# Patient Record
Sex: Female | Born: 1975 | Race: White | Hispanic: No | Marital: Single | Smoking: Current some day smoker
Health system: Southern US, Community
[De-identification: ages and names within clinical notes are randomized; demographics above are authoritative.]

## PROBLEM LIST (undated history)

## (undated) DIAGNOSIS — I10 Essential (primary) hypertension: Secondary | ICD-10-CM

---

## 2003-12-29 HISTORY — PX: PARTIAL HYSTERECTOMY: SHX80

## 2005-02-12 ENCOUNTER — Ambulatory Visit: Payer: Self-pay | Admitting: Obstetrics & Gynecology

## 2005-02-25 ENCOUNTER — Emergency Department: Payer: Self-pay | Admitting: Emergency Medicine

## 2005-02-25 ENCOUNTER — Ambulatory Visit: Payer: Self-pay | Admitting: Internal Medicine

## 2011-09-04 ENCOUNTER — Ambulatory Visit: Payer: Self-pay

## 2011-09-10 ENCOUNTER — Ambulatory Visit: Payer: Self-pay

## 2012-03-01 ENCOUNTER — Ambulatory Visit: Payer: Self-pay

## 2014-07-17 ENCOUNTER — Emergency Department: Payer: Self-pay | Admitting: Emergency Medicine

## 2014-07-17 LAB — COMPREHENSIVE METABOLIC PANEL
Albumin: 3.5 g/dL (ref 3.4–5.0)
Alkaline Phosphatase: 62 U/L
Anion Gap: 4 — ABNORMAL LOW (ref 7–16)
BUN: 8 mg/dL (ref 7–18)
Bilirubin,Total: 0.2 mg/dL (ref 0.2–1.0)
Calcium, Total: 8.5 mg/dL (ref 8.5–10.1)
Chloride: 105 mmol/L (ref 98–107)
Co2: 28 mmol/L (ref 21–32)
Creatinine: 0.72 mg/dL (ref 0.60–1.30)
EGFR (African American): >60
EGFR (Non-African Amer.): >60
Glucose: 103 mg/dL — ABNORMAL HIGH (ref 65–99)
Osmolality: 272 (ref 275–301)
Potassium: 3.6 mmol/L (ref 3.5–5.1)
SGOT(AST): 25 U/L (ref 15–37)
SGPT (ALT): 20 U/L (ref 12–78)
Sodium: 137 mmol/L (ref 136–145)
Total Protein: 7.6 g/dL (ref 6.4–8.2)

## 2014-07-17 LAB — TROPONIN I: Troponin-I: <0.02 ng/mL

## 2014-07-17 LAB — CBC
HCT: 40.2 % (ref 35.0–47.0)
HGB: 13.5 g/dL (ref 12.0–16.0)
MCH: 29.7 pg (ref 26.0–34.0)
MCHC: 33.5 g/dL (ref 32.0–36.0)
MCV: 89 fL (ref 80–100)
Platelet: 204 10*3/uL (ref 150–440)
RBC: 4.54 10*6/uL (ref 3.80–5.20)
RDW: 13.3 % (ref 11.5–14.5)
WBC: 8.7 10*3/uL (ref 3.6–11.0)

## 2014-12-13 DIAGNOSIS — I10 Essential (primary) hypertension: Secondary | ICD-10-CM | POA: Insufficient documentation

## 2014-12-13 DIAGNOSIS — F17210 Nicotine dependence, cigarettes, uncomplicated: Secondary | ICD-10-CM | POA: Insufficient documentation

## 2016-06-17 ENCOUNTER — Emergency Department: Payer: Self-pay

## 2016-06-17 ENCOUNTER — Other Ambulatory Visit: Payer: Self-pay

## 2016-06-17 ENCOUNTER — Observation Stay
Admission: EM | Admit: 2016-06-17 | Discharge: 2016-06-19 | Disposition: A | Discharge status: Home / Self Care | Payer: Self-pay | Attending: General Surgery | Admitting: General Surgery

## 2016-06-17 ENCOUNTER — Encounter: Payer: Self-pay | Admitting: Emergency Medicine

## 2016-06-17 DIAGNOSIS — Z803 Family history of malignant neoplasm of breast: Secondary | ICD-10-CM | POA: Insufficient documentation

## 2016-06-17 DIAGNOSIS — Z880 Allergy status to penicillin: Secondary | ICD-10-CM | POA: Insufficient documentation

## 2016-06-17 DIAGNOSIS — Z90710 Acquired absence of both cervix and uterus: Secondary | ICD-10-CM | POA: Insufficient documentation

## 2016-06-17 DIAGNOSIS — Z87892 Personal history of anaphylaxis: Secondary | ICD-10-CM | POA: Insufficient documentation

## 2016-06-17 DIAGNOSIS — K819 Cholecystitis, unspecified: Secondary | ICD-10-CM

## 2016-06-17 DIAGNOSIS — K810 Acute cholecystitis: Secondary | ICD-10-CM | POA: Insufficient documentation

## 2016-06-17 DIAGNOSIS — F17200 Nicotine dependence, unspecified, uncomplicated: Secondary | ICD-10-CM | POA: Insufficient documentation

## 2016-06-17 DIAGNOSIS — I10 Essential (primary) hypertension: Secondary | ICD-10-CM | POA: Insufficient documentation

## 2016-06-17 DIAGNOSIS — Z8041 Family history of malignant neoplasm of ovary: Secondary | ICD-10-CM | POA: Insufficient documentation

## 2016-06-17 DIAGNOSIS — R1011 Right upper quadrant pain: Secondary | ICD-10-CM

## 2016-06-17 DIAGNOSIS — Z8049 Family history of malignant neoplasm of other genital organs: Secondary | ICD-10-CM | POA: Insufficient documentation

## 2016-06-17 DIAGNOSIS — K8010 Calculus of gallbladder with chronic cholecystitis without obstruction: Principal | ICD-10-CM | POA: Insufficient documentation

## 2016-06-17 HISTORY — DX: Essential (primary) hypertension: I10

## 2016-06-17 LAB — URINALYSIS COMPLETE WITH MICROSCOPIC (ARMC ONLY)
Bilirubin Urine: NEGATIVE
Glucose, UA: 50 mg/dL — AB
Ketones, ur: NEGATIVE mg/dL
Leukocytes, UA: NEGATIVE
Nitrite: NEGATIVE
Protein, ur: 30 mg/dL — AB
Specific Gravity, Urine: 1.015 (ref 1.005–1.030)
pH: 7 (ref 5.0–8.0)

## 2016-06-17 LAB — CBC
HCT: 41 % (ref 35.0–47.0)
Hemoglobin: 13.9 g/dL (ref 12.0–16.0)
MCH: 29.2 pg (ref 26.0–34.0)
MCHC: 33.9 g/dL (ref 32.0–36.0)
MCV: 85.9 fL (ref 80.0–100.0)
Platelets: 228 10*3/uL (ref 150–440)
RBC: 4.77 MIL/uL (ref 3.80–5.20)
RDW: 14.8 % — ABNORMAL HIGH (ref 11.5–14.5)
WBC: 13.4 10*3/uL — ABNORMAL HIGH (ref 3.6–11.0)

## 2016-06-17 LAB — COMPREHENSIVE METABOLIC PANEL
ALT: 20 U/L (ref 14–54)
AST: 19 U/L (ref 15–41)
Albumin: 3.8 g/dL (ref 3.5–5.0)
Alkaline Phosphatase: 94 U/L (ref 38–126)
Anion gap: 8 (ref 5–15)
BUN: 10 mg/dL (ref 6–20)
CO2: 27 mmol/L (ref 22–32)
Calcium: 9.9 mg/dL (ref 8.9–10.3)
Chloride: 101 mmol/L (ref 101–111)
Creatinine, Ser: 0.81 mg/dL (ref 0.44–1.00)
GFR calc Af Amer: >60 mL/min (ref >60)
GFR calc non Af Amer: >60 mL/min (ref >60)
Glucose, Bld: 141 mg/dL — ABNORMAL HIGH (ref 65–99)
Potassium: 3.8 mmol/L (ref 3.5–5.1)
Sodium: 136 mmol/L (ref 135–145)
Total Bilirubin: 0.4 mg/dL (ref 0.3–1.2)
Total Protein: 7.6 g/dL (ref 6.5–8.1)

## 2016-06-17 LAB — LIPASE, BLOOD: Lipase: 27 U/L (ref 11–51)

## 2016-06-17 MED ORDER — DIPHENHYDRAMINE HCL 50 MG/ML IJ SOLN: 25.0000 mg | Freq: Four times a day (QID) | INTRAMUSCULAR | Status: DC | PRN

## 2016-06-17 MED ORDER — SODIUM CHLORIDE 0.9 % IV BOLUS (SEPSIS)
1000.0000 mL | Freq: Once | INTRAVENOUS | Status: AC
  Administered 2016-06-17: 1000 mL via INTRAVENOUS

## 2016-06-17 MED ORDER — DIATRIZOATE MEGLUMINE & SODIUM 66-10 % PO SOLN
15.0000 mL | Freq: Once | ORAL | Status: AC
  Administered 2016-06-17: 15 mL via ORAL

## 2016-06-17 MED ORDER — DIPHENHYDRAMINE HCL 25 MG PO CAPS: 25.0000 mg | ORAL_CAPSULE | Freq: Four times a day (QID) | ORAL | Status: DC | PRN

## 2016-06-17 MED ORDER — FENTANYL CITRATE (PF) 100 MCG/2ML IJ SOLN
25.0000 ug | Freq: Once | INTRAMUSCULAR | Status: AC
  Administered 2016-06-17: 25 ug via INTRAVENOUS
  Filled 2016-06-17: qty 2

## 2016-06-17 MED ORDER — FAMOTIDINE IN NACL 20-0.9 MG/50ML-% IV SOLN
20.0000 mg | Freq: Once | INTRAVENOUS | Status: AC
  Administered 2016-06-17: 20 mg via INTRAVENOUS
  Filled 2016-06-17: qty 50

## 2016-06-17 MED ORDER — FENTANYL CITRATE (PF) 100 MCG/2ML IJ SOLN
50.0000 ug | Freq: Once | INTRAMUSCULAR | Status: AC
  Administered 2016-06-17: 50 ug via INTRAVENOUS
  Filled 2016-06-17: qty 2

## 2016-06-17 MED ORDER — MORPHINE SULFATE (PF) 4 MG/ML IV SOLN
4.0000 mg | INTRAVENOUS | Status: DC | PRN
  Administered 2016-06-17: 2 mg via INTRAVENOUS
  Administered 2016-06-18 (×3): 4 mg via INTRAVENOUS
  Filled 2016-06-17 (×4): qty 1

## 2016-06-17 MED ORDER — ONDANSETRON 4 MG PO TBDP
4.0000 mg | ORAL_TABLET | Freq: Four times a day (QID) | ORAL | Status: DC | PRN
  Filled 2016-06-17: qty 1

## 2016-06-17 MED ORDER — GI COCKTAIL ~~LOC~~
30.0000 mL | Freq: Once | ORAL | Status: AC
  Administered 2016-06-17: 30 mL via ORAL
  Filled 2016-06-17: qty 30

## 2016-06-17 MED ORDER — HYDROCHLOROTHIAZIDE 25 MG PO TABS
25.0000 mg | ORAL_TABLET | Freq: Every day | ORAL | Status: DC
  Administered 2016-06-17: 25 mg via ORAL
  Filled 2016-06-17: qty 1

## 2016-06-17 MED ORDER — MORPHINE SULFATE (PF) 4 MG/ML IV SOLN
4.0000 mg | Freq: Once | INTRAVENOUS | Status: AC
Start: 2016-06-17 — End: 2016-06-17
  Administered 2016-06-17: 4 mg via INTRAVENOUS
  Filled 2016-06-17: qty 1

## 2016-06-17 MED ORDER — ONDANSETRON HCL 4 MG/2ML IJ SOLN
4.0000 mg | Freq: Four times a day (QID) | INTRAMUSCULAR | Status: DC | PRN
  Administered 2016-06-18 (×2): 4 mg via INTRAVENOUS
  Filled 2016-06-17: qty 2

## 2016-06-17 MED ORDER — KCL IN DEXTROSE-NACL 20-5-0.45 MEQ/L-%-% IV SOLN
INTRAVENOUS | Status: DC
  Administered 2016-06-18: 02:00:00 via INTRAVENOUS
  Filled 2016-06-17 (×7): qty 1000

## 2016-06-17 MED ORDER — FAMOTIDINE IN NACL 20-0.9 MG/50ML-% IV SOLN
20.0000 mg | Freq: Two times a day (BID) | INTRAVENOUS | Status: DC
  Administered 2016-06-17: 20 mg via INTRAVENOUS
  Filled 2016-06-17 (×2): qty 50

## 2016-06-17 MED ORDER — MORPHINE SULFATE (PF) 4 MG/ML IV SOLN: 4.0000 mg | Freq: Once | INTRAVENOUS | Status: DC

## 2016-06-17 MED ORDER — HYDRALAZINE HCL 20 MG/ML IJ SOLN: 10.0000 mg | INTRAMUSCULAR | Status: DC | PRN

## 2016-06-17 MED ORDER — CIPROFLOXACIN IN D5W 400 MG/200ML IV SOLN
400.0000 mg | Freq: Two times a day (BID) | INTRAVENOUS | Status: DC
  Administered 2016-06-17 – 2016-06-18 (×3): 400 mg via INTRAVENOUS
  Filled 2016-06-17 (×5): qty 200

## 2016-06-17 MED ORDER — IOPAMIDOL (ISOVUE-300) INJECTION 61%
100.0000 mL | Freq: Once | INTRAVENOUS | Status: AC | PRN
  Administered 2016-06-17: 100 mL via INTRAVENOUS

## 2016-06-17 NOTE — ED Provider Notes (Signed)
CSN: [id number]     Arrival date & time 06/17/16  1020 History   First MD Initiated Contact with Patient 06/17/16 1111     Chief Complaint  Patient presents with  . Abdominal Pain     (Consider location/radiation/quality/duration/timing/severity/associated sxs/prior Treatment) The history is provided by the patient.  Ansley Mangiapane is a 40 y.o. female history of hypertension not on meds here presenting with abdominal pain. Epigastric pain for the last several months. It's that since yesterday and has been constant and radiated to her back and to her umbilicus. Feeling nauseated but denies any vomiting. She denies any urinary symptoms or any constipation or diarrhea. Denies any fevers. Patient states that she has a history of hypertension but does not take any medications because she has no insurance. Denies any chest pain or shortness breath.      Past Medical History  Diagnosis Date  . Hypertension    History reviewed. No pertinent past surgical history. History reviewed. No pertinent family history. Social History  Substance Use Topics  . Smoking status: Current Every Day Smoker  . Smokeless tobacco: None  . Alcohol Use: Yes   OB History    No data available     Review of Systems  Gastrointestinal: Positive for abdominal pain.  All other systems reviewed and are negative.     Allergies  Penicillins  Home Medications   Prior to Admission medications   Not on File   BP 175/90 mmHg  Pulse 87  Temp(Src) 98.5 F (36.9 C) (Oral)  Resp 16  Ht  (1.575 m)  Wt 200 lb (90.719 kg)  BMI 36.57 kg/m2  SpO2 100% Physical Exam  Constitutional: She is oriented to person, place, and time.  Slightly uncomfortable   HENT:  Head: Normocephalic.  Mouth/Throat: Oropharynx is clear and moist.  Eyes: Conjunctivae are normal. Pupils are equal, round, and reactive to light.  Neck: Normal range of motion. Neck supple.  Cardiovascular: Normal rate, regular rhythm and normal  heart sounds.   Pulmonary/Chest: Effort normal and breath sounds normal. No respiratory distress. She has no wheezes. She has no rales.  Abdominal: Soft. Bowel sounds are normal.  + mild diffuse upper abdominal tenderness and periumbilical tenderness. No murphy sign   Musculoskeletal: Normal range of motion. She exhibits no edema or tenderness.  Neurological: She is alert and oriented to person, place, and time. No cranial nerve deficit. Coordination normal.  Skin: Skin is warm and dry.  Psychiatric: She has a normal mood and affect. Her behavior is normal. Judgment and thought content normal.  Nursing note and vitals reviewed.   ED Course  Procedures (including critical care time) Labs Review Labs Reviewed  COMPREHENSIVE METABOLIC PANEL - Abnormal; Notable for the following:    Glucose, Bld 141 (*)    All other components within normal limits  CBC - Abnormal; Notable for the following:    WBC 13.4 (*)    RDW 14.8 (*)    All other components within normal limits  URINALYSIS COMPLETEWITH MICROSCOPIC ([facility] ONLY) - Abnormal; Notable for the following:    Color, Urine YELLOW (*)    APPearance CLOUDY (*)    Glucose, UA 50 (*)    Hgb urine dipstick 1+ (*)    Protein, ur 30 (*)    Bacteria, UA RARE (*)    Squamous Epithelial / LPF 0-5 (*)    All other components within normal limits  LIPASE, BLOOD    Imaging Review Ct Abdomen Pelvis W  Contrast  06/17/2016  CLINICAL DATA:  Mid upper abdominal pain, nausea starting 3 a.m. today EXAM: CT ABDOMEN AND PELVIS WITH CONTRAST TECHNIQUE: Multidetector CT imaging of the abdomen and pelvis was performed using the standard protocol following bolus administration of intravenous contrast. CONTRAST:  100mL ISOVUE-300 IOPAMIDOL (ISOVUE-300) INJECTION 61% COMPARISON:  CT 09/10/2011 FINDINGS: Lower chest: Lung bases are clear. No effusions. Heart is normal size. Hepatobiliary: No focal hepatic abnormality. Gallbladder is distended. Layering gallstone  within the proximal gallbladder and possibly within the gallbladder neck. Pancreas: No focal abnormality or ductal dilatation. Spleen: 3.8 cm lesion within the spleen, enlarged since prior study when this measured 1.7 cm. Adrenals/Urinary Tract: No adrenal abnormality. No focal renal abnormality. No stones or hydronephrosis. Urinary bladder is unremarkable. Stomach/Bowel: Appendix is normal. Stomach, large and small bowel grossly unremarkable. Vascular/Lymphatic: No evidence of aneurysm or adenopathy. Reproductive: Prior hysterectomy.  No adnexal masses. Other: No free fluid or free air. Musculoskeletal: No acute bony abnormality. IMPRESSION: Gallstones within the gallbladder and possibly gallbladder neck. Gallbladder is mildly distended. If there is clinical concern for cholecystitis, ultrasound may be useful. Enlarging low-density splenic lesion, now 3.8 cm. It is most likely reflects enlarging hemangioma. Consider further evaluation with MRI. Normal appendix. Electronically Signed   By: Charlett NoseKevin  Dover M.D.   On: 06/17/2016 13:17   [place] Abdomen Limited Ruq  06/17/2016  CLINICAL DATA:  RIGHT upper quadrant abdominal pain, hypertension, smoker EXAM: US ABDOMEN LIMITED - RIGHT UPPER QUADRANT COMPARISON:  CT abdomen and pelvis 06/17/2016 FINDINGS: Gallbladder: Multiple shadowing calculi in gallbladder up to 18 mm diameter. Some of the stones are mobile but a fixed stone 9 mm diameter is seen at the gallbladder neck. Mild gallbladder wall thickening. No pericholecystic fluid or sonographic Murphy sign. Early acute cholecystitis questioned. Common bile duct: Diameter: 3 mm diameter , normal Liver: Normal appearance No RIGHT upper quadrant free fluid IMPRESSION: Cholelithiasis with fixed stone in gallbladder neck and thickened gallbladder wall, cannot exclude acute cholecystitis. No biliary dilatation. Electronically Signed   By: Ulyses SouthwardMark  Boles M.D.   On: 06/17/2016 14:15   I have personally reviewed and evaluated these  images and lab results as part of my medical decision-making.   EKG Interpretation None       ED ECG REPORT I, YAO, DAVID, the attending physician, personally viewed and interpreted this ECG.   Date: 06/17/2016  EKG Time: 10:45 am  Rate: 77  Rhythm: normal EKG, normal sinus rhythm  Axis: normal  Intervals:none  ST&T Change: nonspecific    MDM   Final diagnoses:  RUQ abdominal pain   Wilhelmina McardleStephanie Yuhasz is a 40 y.o. female here with ab pain. Chronic abdominal pain worsening and constant since yesterday. Mild epigastric and periumbilical tenderness. Consider biliary colic vs early appy. Will get labs, UA, CT ab/pel. Hypertensive 175/90 but has hx of hypertension and not on meds. I doubt dissection or ACS. Can be secondary to pain vs worsening chronic hypertension. Will give HCTZ and reassess.   2:32 PM CT showed stone in the gallbladder, nl appendix. RUQ US showed cholelithiasis with stone in the neck and thickened wall concerning for possible acute chole. Still has pain despite multiple rounds of pain meds. WBC 13, LFTs nl. Consulted surgery to admit for acute cholecystitis.   2: 50 pm Talked to Dr. Orvis BrillLoflin from surgery. She will be in surgery for several hours. She states that patient can wait for her or see her in the office. Patient still in pain and rather wait  to get cholecystectomy. Will keep NPO and continue IVF.   6 pm Dr. Orvis Brill still in surgery. Talked to her nurse. Patient still in pain and has been NPO. Dr. Orvis Brill will see patient after she is done with her case.     Richardean Canal, MD 06/17/16 [phone]

## 2016-06-17 NOTE — H&P (Signed)
Patient ID: Jasmine Miller, female   DOB: 13-Sep-1976, 40 y.o.   MRN: [id number]  CC: RUQ PAIN  HPI Jasmine McardleStephanie Dettinger is a 40 y.o. female who presents emergency department today after an acute onset of right upper quadrant pain. Patient reports that the pain started acutely overnight and did not let up. This prompted her to come to the emergency department earlier today. Patient describes the pain as being a band that goes across her entire midabdomen and shoots around to her right back. It is a sharp stabbing pain minutes at its worst. She had nausea but no vomiting and denies any fevers, chills, chest pain, shortness of breath, diarrhea, constipation. She states that over the last several months she's had intermittent pains just like this. However, those pains would only last for 30 minutes to 2 hours and then go away. This pain has continued and returned every couple hours after pain medications in the ER. Other than this she's been in her usual state of health. She is known to have hypertension but does not take any meds.  HPI  Past Medical History  Diagnosis Date  . Hypertension     History reviewed. No pertinent past surgical history. Patient has had a hysterectomy  History reviewed. No pertinent family history. Multiple family members in her past have had cancer. This includes breast, ovarian, cervical.  Social History Social History  Substance Use Topics  . Smoking status: Current Every Day Smoker  . Smokeless tobacco: None  . Alcohol Use: Yes    Allergies  Allergen Reactions  . Penicillins Anaphylaxis    Current Facility-Administered Medications  Medication Dose Route Frequency Provider Last Rate Last Dose  . hydrochlorothiazide (HYDRODIURIL) tablet 25 mg  25 mg Oral Daily Richardean Canalavid H Yao, MD   25 mg at 06/17/16 1151   No current outpatient prescriptions on file.     Review of Systems A Multi-point review of systems was asked and was negative except for the findings  documented in the history of present illness  Physical Exam Blood pressure 156/88, pulse 57, temperature 98.5 F (36.9 C), temperature source Oral, resp. rate 16, height 5\' 2"  (1.575 m), weight 90.719 kg (200 lb), SpO2 97 %. CONSTITUTIONAL: No acute distress. EYES: Pupils are equal, round, and reactive to light, Sclera are non-icteric. EARS, NOSE, MOUTH AND THROAT: The oropharynx is clear. The oral mucosa is pink and moist. Hearing is intact to voice. LYMPH NODES:  Lymph nodes in the neck are normal. RESPIRATORY:  Lungs are clear. There is normal respiratory effort, with equal breath sounds bilaterally, and without pathologic use of accessory muscles. CARDIOVASCULAR: Heart is regular without murmurs, gallops, or rubs. GI: The abdomen is soft, and nondistended. Mild tenderness to deep palpation in the right upper quadrant. Negative Murphy sign. There are no palpable masses. There is no hepatosplenomegaly. There are normal bowel sounds in all quadrants. GU: Rectal deferred.   MUSCULOSKELETAL: Normal muscle strength and tone. No cyanosis or edema.   SKIN: Turgor is good and there are no pathologic skin lesions or ulcers. NEUROLOGIC: Motor and sensation is grossly normal. Cranial nerves are grossly intact. PSYCH:  Oriented to person, place and time. Affect is normal.  Data Reviewed Images and labs reviewed. Labs significant with a leukocytosis of 13.4. CT scan and ultrasound both show gallstones and a minimally thickened gallbladder wall at 5 mm on ultrasound. There is no pericholecystic fluid or ductal dilatation. All of her liver functions are within normal limits and her labs. I  have personally reviewed the patient's imaging, laboratory findings and medical records.    Assessment    Cholecystitis    Plan    40 year old female with persistent right upper quadrant pain consistent with cholecystitis. Patient also has a mild leukocytosis and possible gallbladder wall thickening in the setting  of gallstones. The procedure of a laparoscopic cholecystectomy was described in detail.  We discussed the risks and benefits of a laparoscopic cholecystectomy and possible cholangiogram including, but not limited to bleeding, infection, injury to surrounding structures such as the intestine or liver, bile leak, retained gallstones, need to convert to an open procedure, prolonged diarrhea, blood clots such as  DVT, common bile duct injury, anesthesia risks, and possible need for additional procedures.  The likelihood of improvement in symptoms and return to the patient's normal status is good. We discussed the typical post-operative recovery course. Plan will be for admission under observation. We will allow her to have a diet tonight but nothing to eat after midnight. Likely to the operating room first thing in the morning for laparoscopic cholecystectomy to be done by my partner Dr. Orvis Brill.      Time spent with the patient was 30 minutes, with more than 50% of the time spent in face-to-face education, counseling and care coordination.     Ricarda Frame, MD FACS General Surgeon 06/17/2016, 8:58 PM

## 2016-06-17 NOTE — ED Notes (Signed)
Surgery consult at bedside.

## 2016-06-17 NOTE — ED Notes (Signed)
Pt presents with abd pain.

## 2016-06-17 NOTE — ED Provider Notes (Signed)
I accepted care from Dr. Silverio LayYao at 8:45 PM.  I spoke with Dr. Armond HangWoodrum after he saw the patient. This patient will be admitted overnight. She is allowed to eat and then be nothing by mouth after midnight. Likely surgery in the morning.  Governor Rooksebecca Gwenn Teodoro, MD 06/17/16 2046

## 2016-06-18 ENCOUNTER — Encounter: Payer: Self-pay | Admitting: *Deleted

## 2016-06-18 ENCOUNTER — Observation Stay: Payer: MEDICAID | Admitting: Anesthesiology

## 2016-06-18 ENCOUNTER — Encounter
Admission: EM | Disposition: A | Discharge status: Home / Self Care | Payer: Self-pay | Source: Home / Self Care | Attending: Emergency Medicine

## 2016-06-18 DIAGNOSIS — K810 Acute cholecystitis: Secondary | ICD-10-CM

## 2016-06-18 HISTORY — PX: CHOLECYSTECTOMY: SHX55

## 2016-06-18 SURGERY — LAPAROSCOPIC CHOLECYSTECTOMY WITH INTRAOPERATIVE CHOLANGIOGRAM
Anesthesia: General

## 2016-06-18 MED ORDER — DEXAMETHASONE SODIUM PHOSPHATE 10 MG/ML IJ SOLN
INTRAMUSCULAR | Status: DC | PRN
  Administered 2016-06-18: 10 mg via INTRAVENOUS

## 2016-06-18 MED ORDER — BUPIVACAINE HCL (PF) 0.5 % IJ SOLN
INTRAMUSCULAR | Status: AC
  Filled 2016-06-18: qty 30

## 2016-06-18 MED ORDER — ONDANSETRON HCL 4 MG/2ML IJ SOLN
4.0000 mg | Freq: Once | INTRAMUSCULAR | Status: DC | PRN
Start: 2016-06-18 — End: 2016-06-18

## 2016-06-18 MED ORDER — LIDOCAINE HCL (CARDIAC) 20 MG/ML IV SOLN
INTRAVENOUS | Status: DC | PRN
  Administered 2016-06-18: 100 mg via INTRAVENOUS

## 2016-06-18 MED ORDER — CYCLOBENZAPRINE HCL 10 MG PO TABS
10.0000 mg | ORAL_TABLET | Freq: Three times a day (TID) | ORAL | Status: DC
  Administered 2016-06-18 – 2016-06-19 (×2): 10 mg via ORAL
  Filled 2016-06-18 (×2): qty 1

## 2016-06-18 MED ORDER — FENTANYL CITRATE (PF) 100 MCG/2ML IJ SOLN
INTRAMUSCULAR | Status: AC
  Administered 2016-06-18: 25 ug via INTRAVENOUS
  Filled 2016-06-18: qty 2

## 2016-06-18 MED ORDER — ACETAMINOPHEN 10 MG/ML IV SOLN
INTRAVENOUS | Status: AC
  Filled 2016-06-18: qty 100

## 2016-06-18 MED ORDER — ROCURONIUM BROMIDE 100 MG/10ML IV SOLN
INTRAVENOUS | Status: DC | PRN
  Administered 2016-06-18: 45 mg via INTRAVENOUS
  Administered 2016-06-18: 5 mg via INTRAVENOUS
  Administered 2016-06-18: 20 mg via INTRAVENOUS

## 2016-06-18 MED ORDER — BUPIVACAINE HCL (PF) 0.5 % IJ SOLN
INTRAMUSCULAR | Status: DC | PRN
  Administered 2016-06-18: 30 mL

## 2016-06-18 MED ORDER — SUCCINYLCHOLINE CHLORIDE 20 MG/ML IJ SOLN
INTRAMUSCULAR | Status: DC | PRN
  Administered 2016-06-18: 80 mg via INTRAVENOUS

## 2016-06-18 MED ORDER — LACTATED RINGERS IV SOLN
INTRAVENOUS | Status: DC | PRN
  Administered 2016-06-18: 13:00:00 via INTRAVENOUS
  Administered 2016-06-18: 1000 mL

## 2016-06-18 MED ORDER — OXYCODONE HCL 5 MG PO TABS
5.0000 mg | ORAL_TABLET | ORAL | Status: DC | PRN
  Administered 2016-06-18: 10 mg via ORAL
  Administered 2016-06-19: 5 mg via ORAL
  Administered 2016-06-19: 10 mg via ORAL
  Filled 2016-06-18: qty 1
  Filled 2016-06-18 (×2): qty 2

## 2016-06-18 MED ORDER — FENTANYL CITRATE (PF) 100 MCG/2ML IJ SOLN
INTRAMUSCULAR | Status: DC | PRN
  Administered 2016-06-18 (×4): 50 ug via INTRAVENOUS

## 2016-06-18 MED ORDER — MORPHINE SULFATE (PF) 4 MG/ML IV SOLN
4.0000 mg | INTRAVENOUS | Status: DC | PRN
  Administered 2016-06-18: 4 mg via INTRAVENOUS
  Filled 2016-06-18: qty 1

## 2016-06-18 MED ORDER — FENTANYL CITRATE (PF) 100 MCG/2ML IJ SOLN
25.0000 ug | INTRAMUSCULAR | Status: DC | PRN
  Administered 2016-06-18 (×4): 25 ug via INTRAVENOUS

## 2016-06-18 MED ORDER — FENTANYL CITRATE (PF) 100 MCG/2ML IJ SOLN: INTRAMUSCULAR | Status: DC | PRN

## 2016-06-18 MED ORDER — KETOROLAC TROMETHAMINE 30 MG/ML IJ SOLN
INTRAMUSCULAR | Status: DC | PRN
  Administered 2016-06-18: 30 mg via INTRAVENOUS

## 2016-06-18 MED ORDER — ONDANSETRON HCL 4 MG/2ML IJ SOLN
INTRAMUSCULAR | Status: AC
  Filled 2016-06-18: qty 2

## 2016-06-18 MED ORDER — MIDAZOLAM HCL 2 MG/2ML IJ SOLN
INTRAMUSCULAR | Status: DC | PRN
  Administered 2016-06-18: 2 mg via INTRAVENOUS

## 2016-06-18 MED ORDER — SUGAMMADEX SODIUM 200 MG/2ML IV SOLN
INTRAVENOUS | Status: DC | PRN
  Administered 2016-06-18: 181.4 mg via INTRAVENOUS

## 2016-06-18 MED ORDER — PROPOFOL 10 MG/ML IV BOLUS
INTRAVENOUS | Status: DC | PRN
  Administered 2016-06-18: 150 mg via INTRAVENOUS

## 2016-06-18 MED ORDER — KETOROLAC TROMETHAMINE 30 MG/ML IJ SOLN
30.0000 mg | Freq: Four times a day (QID) | INTRAMUSCULAR | Status: DC
  Administered 2016-06-18 – 2016-06-19 (×3): 30 mg via INTRAVENOUS
  Filled 2016-06-18 (×3): qty 1

## 2016-06-18 MED ORDER — ACETAMINOPHEN 325 MG PO TABS: 650.0000 mg | ORAL_TABLET | Freq: Four times a day (QID) | ORAL | Status: DC

## 2016-06-18 MED ORDER — ACETAMINOPHEN 10 MG/ML IV SOLN
INTRAVENOUS | Status: DC | PRN
  Administered 2016-06-18: 1000 mg via INTRAVENOUS

## 2016-06-18 MED ORDER — PHENYLEPHRINE HCL 10 MG/ML IJ SOLN
INTRAMUSCULAR | Status: DC | PRN
  Administered 2016-06-18: 100 ug via INTRAVENOUS
  Administered 2016-06-18: 200 ug via INTRAVENOUS

## 2016-06-18 MED ORDER — EPHEDRINE SULFATE 50 MG/ML IJ SOLN
INTRAMUSCULAR | Status: DC | PRN
Start: 2016-06-18 — End: 2016-06-18
  Administered 2016-06-18: 10 mg via INTRAVENOUS

## 2016-06-18 MED ORDER — PANTOPRAZOLE SODIUM 40 MG PO TBEC
40.0000 mg | DELAYED_RELEASE_TABLET | Freq: Every day | ORAL | Status: DC
  Administered 2016-06-18: 40 mg via ORAL
  Filled 2016-06-18: qty 1

## 2016-06-18 SURGICAL SUPPLY — 42 items
APPLIER CLIP 5 13 M/L LIGAMAX5 (MISCELLANEOUS) ×2
BLADE SURG 15 STRL LF DISP TIS (BLADE) ×1 IMPLANT
BLADE SURG 15 STRL SS (BLADE) ×1
CANISTER SUCT 1200ML W/VALVE (MISCELLANEOUS) ×2 IMPLANT
CATH CHOLANGI 4FR 420404F (CATHETERS) ×2 IMPLANT
CHLORAPREP W/TINT 26ML (MISCELLANEOUS) ×2 IMPLANT
CLIP APPLIE 5 13 M/L LIGAMAX5 (MISCELLANEOUS) ×1 IMPLANT
CONRAY 60ML FOR OR (MISCELLANEOUS) ×2 IMPLANT
DEFOGGER SCOPE WARMER CLEARIFY (MISCELLANEOUS) ×2 IMPLANT
DERMABOND ADVANCED (GAUZE/BANDAGES/DRESSINGS) ×1
DERMABOND ADVANCED .7 DNX12 (GAUZE/BANDAGES/DRESSINGS) ×1 IMPLANT
DRAPE SHEET LG 3/4 BI-LAMINATE (DRAPES) ×2 IMPLANT
ELECT CAUTERY BLADE 6.4 (BLADE) ×2 IMPLANT
ELECT E-Z MONOPOLAR 33 (MISCELLANEOUS) ×2
ELECT REM PT RETURN 9FT ADLT (ELECTROSURGICAL) ×2
ELECTRODE E-Z MONOPOLAR 33 (MISCELLANEOUS) ×1 IMPLANT
ELECTRODE REM PT RTRN 9FT ADLT (ELECTROSURGICAL) ×1 IMPLANT
ENDOPOUCH RETRIEVER 10 (MISCELLANEOUS) ×2 IMPLANT
GLOVE PI ORTHOPRO 6.5 (GLOVE) ×1
GLOVE PI ORTHOPRO STRL 6.5 (GLOVE) ×1 IMPLANT
GOWN STRL REUS W/ TWL LRG LVL3 (GOWN DISPOSABLE) ×3 IMPLANT
GOWN STRL REUS W/TWL LRG LVL3 (GOWN DISPOSABLE) ×3
IRRIGATION STRYKERFLOW (MISCELLANEOUS) ×1 IMPLANT
IRRIGATOR STRYKERFLOW (MISCELLANEOUS) ×2
IV CATH ANGIO 12GX3 LT BLUE (NEEDLE) ×2 IMPLANT
IV NS 1000ML (IV SOLUTION) ×1
IV NS 1000ML BAXH (IV SOLUTION) ×1 IMPLANT
LABEL OR SOLS (LABEL) ×2 IMPLANT
LIQUID BAND (GAUZE/BANDAGES/DRESSINGS) ×2 IMPLANT
NEEDLE HYPO 25X1 1.5 SAFETY (NEEDLE) ×2 IMPLANT
NS IRRIG 500ML POUR BTL (IV SOLUTION) ×2 IMPLANT
PACK LAP CHOLECYSTECTOMY (MISCELLANEOUS) ×2 IMPLANT
PENCIL ELECTRO HAND CTR (MISCELLANEOUS) ×2 IMPLANT
SCISSORS METZENBAUM CVD 33 (INSTRUMENTS) ×2 IMPLANT
SLEEVE ENDOPATH XCEL 5M (ENDOMECHANICALS) ×4 IMPLANT
SUT MNCRL 4-0 (SUTURE) ×1
SUT MNCRL 4-0 27XMFL (SUTURE) ×1
SUT VICRYL 0 AB UR-6 (SUTURE) ×4 IMPLANT
SUTURE MNCRL 4-0 27XMF (SUTURE) ×1 IMPLANT
TROCAR XCEL BLUNT TIP 100MML (ENDOMECHANICALS) ×2 IMPLANT
TROCAR XCEL NON-BLD 5MMX100MML (ENDOMECHANICALS) ×2 IMPLANT
TUBING INSUFFLATOR HI FLOW (MISCELLANEOUS) ×2 IMPLANT

## 2016-06-18 NOTE — Anesthesia Procedure Notes (Signed)
Procedure Name: Intubation Date/Time: 06/18/2016 1:39 PM Performed by: Junious SilkNOLES, Redith Drach Pre-anesthesia Checklist: Patient identified, Patient being monitored, Timeout performed, Emergency Drugs available and Suction available Patient Re-evaluated:Patient Re-evaluated prior to inductionOxygen Delivery Method: Circle system utilized Preoxygenation: Pre-oxygenation with 100% oxygen Intubation Type: IV induction Ventilation: Mask ventilation without difficulty Laryngoscope Size: Mac and 3 Grade View: Grade I Tube type: Oral Tube size: 7.0 mm Number of attempts: 1 Airway Equipment and Method: Stylet Placement Confirmation: ETT inserted through vocal cords under direct vision,  positive ETCO2 and breath sounds checked- equal and bilateral Secured at: 21 cm Tube secured with: Tape Dental Injury: Teeth and Oropharynx as per pre-operative assessment

## 2016-06-18 NOTE — Transfer of Care (Signed)
Immediate Anesthesia Transfer of Care Note  Patient: Wilhelmina McardleStephanie Rivero  Procedure(s) Performed: Procedure(s): LAPAROSCOPIC CHOLECYSTECTOMY WITH INTRAOPERATIVE CHOLANGIOGRAM (N/A)  Patient Location: PACU  Anesthesia Type:General  Level of Consciousness: awake and sedated  Airway & Oxygen Therapy: Patient Spontanous Breathing and Patient connected to face mask oxygen  Post-op Assessment: Report given to RN and Post -op Vital signs reviewed and stable  Post vital signs: Reviewed and stable  Last Vitals:  Filed Vitals:   06/18/16 1546 06/18/16 1615  BP: 140/75 142/79  Pulse: 64 67  Temp:  37 C  Resp: 14 18    Last Pain:  Filed Vitals:   06/18/16 1622  PainSc: 5          Complications: No apparent anesthesia complications

## 2016-06-18 NOTE — Interval H&P Note (Signed)
History and Physical Interval Note:  06/18/2016 12:36 PM  Jasmine McardleStephanie Bermea  has presented today for surgery, with the diagnosis of cholecystitis  The various methods of treatment have been discussed with the patient and family. After consideration of risks, benefits and other options for treatment, the patient has consented to  Procedure(s): LAPAROSCOPIC CHOLECYSTECTOMY WITH INTRAOPERATIVE CHOLANGIOGRAM (N/A) as a surgical intervention .  The patient's history has been reviewed, patient examined, no change in status, stable for surgery.  I have reviewed the patient's chart and labs.  Questions were answered to the patient's satisfaction.     Hans Rusher L Aldonia Keeven

## 2016-06-18 NOTE — Anesthesia Preprocedure Evaluation (Signed)
Anesthesia Evaluation  Patient identified by MRN, date of birth, ID band Patient awake    Reviewed: Allergy & Precautions, NPO status , Patient's Chart, lab work & pertinent test results, reviewed documented beta blocker date and time   Airway Mallampati: II  TM Distance: >3 FB     Dental  (+) Chipped   Pulmonary Current Smoker,           Cardiovascular hypertension, Pt. on medications      Neuro/Psych    GI/Hepatic   Endo/Other    Renal/GU      Musculoskeletal   Abdominal   Peds  Hematology   Anesthesia Other Findings   Reproductive/Obstetrics                             Anesthesia Physical Anesthesia Plan  ASA: III  Anesthesia Plan: General   Post-op Pain Management:    Induction: Intravenous  Airway Management Planned: Oral ETT  Additional Equipment:   Intra-op Plan:   Post-operative Plan:   Informed Consent: I have reviewed the patients History and Physical, chart, labs and discussed the procedure including the risks, benefits and alternatives for the proposed anesthesia with the patient or authorized representative who has indicated his/her understanding and acceptance.     Plan Discussed with: CRNA  Anesthesia Plan Comments:         Anesthesia Quick Evaluation

## 2016-06-18 NOTE — Op Note (Signed)
Laparoscopic Cholecystectomy Procedure Note  Indications: This patient presents with symptomatic gallbladder disease and will undergo laparoscopic cholecystectomy.  Pre-operative Diagnosis: Calculus of gallbladder with other cholecystitis, without mention of obstruction  Post-operative Diagnosis: Same  Surgeon: Gladis Riffleatherine L Idaliz Tinkle   Assistants: none  Anesthesia: General endotracheal anesthesia  ASA Class: 2  Procedure Details  The patient was seen again in the Holding Room. The risks, benefits, complications, treatment options, and expected outcomes were discussed with the patient. The possibilities of reaction to medication, pulmonary aspiration, perforation of viscus, bleeding, recurrent infection, finding a normal gallbladder, the need for additional procedures, failure to diagnose a condition, the possible need to convert to an open procedure, and creating a complication requiring transfusion or operation were discussed with the patient. The patient and/or family concurred with the proposed plan, giving informed consent. The site of surgery properly noted/marked. The patient was taken to Operating Room, identified as Jasmine McardleStephanie Miller and the procedure verified as Laparoscopic Cholecystectomy with Intraoperative Cholangiograms. A Time Out was held and the above information confirmed.  Prior to the induction of general anesthesia, antibiotic prophylaxis was administered. General endotracheal anesthesia was then administered and tolerated well. After the induction, the abdomen was prepped in the usual sterile fashion. The patient was positioned in the supine position with the left arm comfortably tucked, along with some reverse Trendelenburg.  Local anesthetic agent was injected into the skin near the umbilicus and an incision made. The midline fascia was incised and the Hasson technique was used to introduce a 10 mm port under direct vision. It was secured with a figure of eight Vicryl suture  placed in the usual fashion. Pneumoperitoneum was then created with CO2 and tolerated well without any adverse changes in the patient's vital signs. Additional trocars were introduced under direct vision. All skin incisions were infiltrated with a local anesthetic agent before making the incision and placing the trocars.   The gallbladder was identified, the fundus grasped and retracted cephalad. Adhesions were lysed bluntly and with the electrocautery where indicated, taking care not to injure any adjacent organs or viscus. The infundibulum was grasped and retracted laterally, exposing the peritoneum overlying the triangle of Calot. This was then divided and exposed in a blunt fashion. The cystic duct was clearly identified and bluntly dissected circumferentially. The junctions of the gallbladder, cystic duct and common bile duct were clearly identified prior to the division of any linear structure.   The cystic duct was then doubly ligated with surgical clips on the patient side and singly clipped on the gallbladder side and divided. The cystic artery was identified, dissected free, ligated with clips and divided as well.   The gallbladder was dissected from the liver bed in retrograde fashion with the electrocautery. The gallbladder was removed. The liver bed was irrigated and inspected. Hemostasis was achieved with the electrocautery. Copious irrigation was utilized and was repeatedly aspirated until clear all particulate matter.  Pneumoperitoneum was completely reduced after viewing removal of the trocars under direct vision. The wound was thoroughly irrigated and the fascia was then closed with a figure of eight suture; the skin was then closed with 4-0 Monocryl and  sterile glue was applied.  Instrument, sponge, and needle counts were correct at closure and at the conclusion of the case.   Findings: Cholecystitis with Cholelithiasis  Estimated Blood Loss: Minimal         Drains: none          Total IV Fluids: 400mL  Specimens: Gallbladder           Complications: None; patient tolerated the procedure well.         Disposition: PACU - hemodynamically stable.         Condition: stable

## 2016-06-19 ENCOUNTER — Encounter: Payer: Self-pay | Admitting: Surgery

## 2016-06-19 MED ORDER — CYCLOBENZAPRINE HCL 10 MG PO TABS: 10.0000 mg | ORAL_TABLET | Freq: Three times a day (TID) | ORAL | Status: DC

## 2016-06-19 MED ORDER — OXYCODONE-ACETAMINOPHEN 5-325 MG PO TABS: 1.0000 | ORAL_TABLET | ORAL | Status: DC | PRN

## 2016-06-19 NOTE — Anesthesia Postprocedure Evaluation (Signed)
Anesthesia Post Note  Patient: Jasmine Miller  Procedure(s) Performed: Procedure(s) (LRB): LAPAROSCOPIC CHOLECYSTECTOMY WITH INTRAOPERATIVE CHOLANGIOGRAM (N/A)  Patient location during evaluation: PACU Anesthesia Type: General Level of consciousness: awake and alert Pain management: pain level controlled Vital Signs Assessment: post-procedure vital signs reviewed and stable Respiratory status: spontaneous breathing, nonlabored ventilation, respiratory function stable and patient connected to nasal cannula oxygen Cardiovascular status: blood pressure returned to baseline and stable Postop Assessment: no signs of nausea or vomiting Anesthetic complications: no    Last Vitals:  Filed Vitals:   06/19/16 0027 06/19/16 0528  BP: 113/62 120/61  Pulse: 65 62  Temp: 36.8 C 36.7 C  Resp: 17 18    Last Pain:  Filed Vitals:   06/19/16 0742  PainSc: 4                  Arron Tetrault S

## 2016-06-19 NOTE — Discharge Summary (Signed)
Physician Discharge Summary  Patient ID: Jasmine Miller MRN: [id number] DOB/AGE: 1976-04-10 40 y.o.  Admit date: 06/17/2016 Discharge date: 06/19/2016  Admission Diagnoses:  Acute cholecystitis   Discharge Diagnoses:  Active Problems:   Cholecystitis   Acute cholecystitis   Discharged Condition: good  Hospital Course: 40 yr old female with acute cholecystitis who had Lap chole on 6/22.  Patient doing well this AM.  She tolerated regular diet. She has been up and moving around.  Her pain is well controlled with oxycodone.  She is passing flatus.  Consults: None  Significant Diagnostic Studies: U/S  Treatments: surgery: Lap chole  Discharge Exam: Blood pressure 120/61, pulse 62, temperature 98.1 F (36.7 C), temperature source Oral, resp. rate 18, height 5\' 2"  (1.575 m), weight 200 lb (90.719 kg), SpO2 95 %. General appearance: alert, cooperative and no distress GI: soft, appropriatly tender, incisions c/d/i Extremities: extremities normal, atraumatic, no cyanosis or edema  Disposition: Final discharge disposition not confirmed  Discharge Instructions    Call MD for:  persistant nausea and vomiting    Complete by:  As directed      Call MD for:  redness, tenderness, or signs of infection (pain, swelling, redness, odor or green/yellow discharge around incision site)    Complete by:  As directed      Call MD for:  severe uncontrolled pain    Complete by:  As directed      Call MD for:  temperature >100.4    Complete by:  As directed      Diet - low sodium heart healthy    Complete by:  As directed      Driving Restrictions    Complete by:  As directed   No driving while on prescription pain medication     Increase activity slowly    Complete by:  As directed      Lifting restrictions    Complete by:  As directed   No lifting over 15lbs for 4 weeks     May shower / Bathe    Complete by:  As directed      May walk up steps    Complete by:  As directed             Medication List    TAKE these medications        cyclobenzaprine 10 MG tablet  Commonly known as:  FLEXERIL  Take 1 tablet (10 mg total) by mouth 3 (three) times daily.     fluticasone 50 MCG/ACT nasal spray  Commonly known as:  FLONASE  Place into the nose.     oxyCODONE-acetaminophen 5-325 MG tablet  Commonly known as:  ROXICET  Take 1-2 tablets by mouth every 4 (four) hours as needed for severe pain.           Follow-up Information    Follow up with Jasmine Riffleatherine L Phylis Javed, MD In 17 days.   Specialty:  Surgery   Why:  f/u Lap chole Dr. Orvis BrillLoflin 6/22   Contact information:   [address] [place] [id number] [phone]       Signed: Gladis RiffleCatherine L Shawnie Miller 06/19/2016, 9:28 AM

## 2016-06-19 NOTE — Progress Notes (Signed)
Patient understands all discharge instructions and the need to attend follow up appointments. Patient discharge via wheelchair with auxillary. 

## 2016-06-22 LAB — SURGICAL PATHOLOGY

## 2016-07-06 ENCOUNTER — Ambulatory Visit: Payer: Self-pay | Admitting: Surgery

## 2016-07-07 ENCOUNTER — Ambulatory Visit: Payer: Self-pay | Admitting: Surgery

## 2016-07-29 ENCOUNTER — Ambulatory Visit (INDEPENDENT_AMBULATORY_CARE_PROVIDER_SITE_OTHER): Payer: Self-pay | Admitting: Surgery

## 2016-07-29 ENCOUNTER — Encounter: Payer: Self-pay | Admitting: Surgery

## 2016-07-29 VITALS — BP 160/108 | Temp 97.8°F | Ht 62.0 in | Wt 186.0 lb

## 2016-07-29 DIAGNOSIS — K810 Acute cholecystitis: Secondary | ICD-10-CM

## 2016-07-29 NOTE — Patient Instructions (Signed)
Please call our office if you have questions or concerns.   

## 2016-07-29 NOTE — Progress Notes (Signed)
40 yr old female s/p Lap chole for acute cholecystitis on 6/22.  Patient doing well today . She states that appetite has been good. Patient states that she's having normal bowel movements but no diarrhea. Patient states that occasionally after she eats she'll have a little bit of abdominal pain in the epigastric area but it passes. Patient does have a little bit of reflux type symptoms as well. Patient has not been on any PPIs. Patient denies any fever chills nausea vomiting or other drainage.   Vitals:   07/29/16 0918  BP: (!) 160/108  Temp: 97.8 F (36.6 C)   Pe:  Gen: NAD Abd: soft, incisions c/d/i, no erythema or drainage  A/P:  Patient doing well after laparoscopic cholecystectomy for acute cholecystitis. 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 I discussed with the patient that her abdominal pain and reflux symptoms sometimes get worse after having the gallbladder out in the start taking an over-the-counter PPI. Patient is to return when necessary.

## 2017-11-26 ENCOUNTER — Encounter: Payer: Self-pay | Admitting: Internal Medicine

## 2017-11-26 ENCOUNTER — Ambulatory Visit: Payer: BLUE CROSS/BLUE SHIELD | Admitting: Internal Medicine

## 2017-11-26 VITALS — BP 146/92 | HR 90 | Temp 98.3°F | Wt 212.8 lb

## 2017-11-26 DIAGNOSIS — Z87898 Personal history of other specified conditions: Secondary | ICD-10-CM | POA: Diagnosis not present

## 2017-11-26 DIAGNOSIS — L539 Erythematous condition, unspecified: Secondary | ICD-10-CM | POA: Diagnosis not present

## 2017-11-26 DIAGNOSIS — F17200 Nicotine dependence, unspecified, uncomplicated: Secondary | ICD-10-CM | POA: Diagnosis not present

## 2017-11-26 DIAGNOSIS — I10 Essential (primary) hypertension: Secondary | ICD-10-CM

## 2017-11-26 DIAGNOSIS — F1411 Cocaine abuse, in remission: Secondary | ICD-10-CM

## 2017-11-26 MED ORDER — VARENICLINE TARTRATE 0.5 MG X 11 & 1 MG X 42 PO MISC: ORAL | 0 refills | Status: DC

## 2017-11-26 MED ORDER — LISINOPRIL 10 MG PO TABS: 10.0000 mg | ORAL_TABLET | Freq: Every day | ORAL | 0 refills | Status: DC

## 2017-11-26 NOTE — Progress Notes (Signed)
HPI  Pt presents to the clinic today to establish care and for management of the conditions listed below. She is transferring care [facility].  HTN: Her BP today is 146/92. She was on Lisinopril in the past but reports she has not taken this in years. She reports intermittent headaches but denies dizziness, blurred vision, chest pain or shortness of breath.  She would also like to discuss smoking cessation. She is finally ready to quit. She has failed Wellbutrin in the past. She would like to try Chantix if it is not expensive.  She would also like for me to check her nasal passages. 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 She is concerned that there may be damage to her nasal passage.  Flu: never Tetanus: > 10 years ago Mammogram: never Pap Smear: > 5 years ago Vision Screening: as needed Screening: as needed  Past Medical History:  Diagnosis Date  . Hypertension     No current outpatient medications on file.   No current facility-administered medications for this visit.     Allergies  Allergen Reactions  . Penicillins Anaphylaxis    No family history on file.  Social History   Socioeconomic History  . Marital status: Single    Spouse name: Not on file  . Number of children: Not on file  . Years of education: Not on file  . Highest education level: Not on file  Social Needs  . Financial resource strain: Not on file  . Food insecurity - worry: Not on file  . Food insecurity - inability: Not on file  . Transportation needs - medical: Not on file  . Transportation needs - non-medical: Not on file  Occupational History  . Not on file  Tobacco Use  . Smoking status: Current Every Day Smoker  . Smokeless tobacco: Never Used  Substance and Sexual Activity  . Alcohol use: Yes  . Drug use: No  . Sexual activity: Not on file  Other Topics Concern  . Not on file  Social History Narrative  . Not on file     ROS:  Constitutional: Pt reports intermittent headache. Denies fever, malaise, fatigue, or abrupt weight changes.  HEENT: Denies eye pain, eye redness, ear pain, ringing in the ears, wax buildup, runny nose, nasal congestion, bloody nose, or sore throat. Respiratory: Denies difficulty breathing, shortness of breath, cough or sputum production.   Cardiovascular: Denies chest pain, chest tightness, palpitations or swelling in the hands or feet.  Gastrointestinal: Denies abdominal pain, bloating, constipation, diarrhea or blood in the stool.  GU: Denies frequency, urgency, pain with urination, blood in urine, odor or discharge. Musculoskeletal: Denies decrease in range of motion, difficulty with gait, muscle pain or joint pain and swelling.  Skin: Denies redness, rashes, lesions or ulcercations.  Neurological: Denies dizziness, difficulty with memory, difficulty with speech or problems with balance and coordination.  Psych: Denies anxiety, depression, SI/HI.  No other specific complaints in a complete review of systems (except as listed in HPI above).  PE:  BP (!) 146/92   Pulse 90   Temp 98.3 F (36.8 C) (Oral)   Wt 212 lb 12 oz (96.5 kg)   SpO2 100%   BMI 38.91 kg/m  Wt Readings from Last 3 Encounters:  11/26/17 212 lb 12 oz (96.5 kg)  07/29/16 186 lb (84.4 kg)  06/17/16 200 lb (90.7 kg)    General: Appears her stated age, obese in NAD.  Skin: Dry and intact. HEENT: Nares: Bilateral nasal passages erythematous, clear mucous noted above turbinates. Cardiovascular: Normal rate and rhythm. S1,S2 noted.  No murmur, rubs or gallops noted.  Pulmonary/Chest: Normal effort and positive vesicular breath sounds. No respiratory distress. No wheezes, rales or ronchi noted.  Neurological: Alert and oriented. Cranial nerves II-XII grossly intact. Coordination normal.  Psychiatric: Mood and affect normal. Behavior is normal. Judgment and thought content normal.     BMET    Component  Value Date/Time   NA 136 06/17/2016 1046   NA 137 07/17/2014 1237   K 3.8 06/17/2016 1046   K 3.6 07/17/2014 1237   CL 101 06/17/2016 1046   CL 105 07/17/2014 1237   CO2 27 06/17/2016 1046   CO2 28 07/17/2014 1237   GLUCOSE 141 (H) 06/17/2016 1046   GLUCOSE 103 (H) 07/17/2014 1237   BUN 10 06/17/2016 1046   BUN 8 07/17/2014 1237   CREATININE 0.81 06/17/2016 1046   CREATININE 0.72 07/17/2014 1237   CALCIUM 9.9 06/17/2016 1046   CALCIUM 8.5 07/17/2014 1237   GFRNONAA >60 06/17/2016 1046   GFRNONAA >60 07/17/2014 1237   GFRAA >60 06/17/2016 1046   GFRAA >60 07/17/2014 1237    Lipid Panel  No results found for: CHOL, TRIG, HDL, CHOLHDL, VLDL, LDLCALC  CBC    Component Value Date/Time   WBC 13.4 (H) 06/17/2016 1046   RBC 4.77 06/17/2016 1046   HGB 13.9 06/17/2016 1046   HGB 13.5 07/17/2014 1237   HCT 41.0 06/17/2016 1046   HCT 40.2 07/17/2014 1237   PLT 228 06/17/2016 1046   PLT 204 07/17/2014 1237   MCV 85.9 06/17/2016 1046   MCV 89 07/17/2014 1237   MCH 29.2 06/17/2016 1046   MCHC 33.9 06/17/2016 1046   RDW 14.8 (H) 06/17/2016 1046   RDW 13.3 07/17/2014 1237    Hgb A1C No results found for: HGBA1C   Assessment and Plan:  Nasal Erythema:  Secondary to previous drug use Offered referral to ENT- she declines at this time  Smoker, Motivated to Quit:  Support offered today Handout given on smoking cessation RX for Chantix provided  RTC in 3 weeks for your annual exam, follow up HTN Kristyana Notte, NP

## 2017-11-26 NOTE — Assessment & Plan Note (Signed)
Will start Lisinopril 10 mg daily Discussed DASH diet and exercise for weight loss

## 2017-11-26 NOTE — Patient Instructions (Signed)
Steps to Quit Smoking Smoking tobacco can be bad for your health. It can also affect almost every organ in your body. Smoking puts you and people around you at risk for many serious long-lasting (chronic) diseases. Quitting smoking is hard, but it is one of the best things that you can do for your health. It is never too late to quit. What are the benefits of quitting smoking? When you quit smoking, you lower your risk for getting serious diseases and conditions. They can include:  Lung cancer or lung disease.  Heart disease.  Stroke.  Heart attack.  Not being able to have children (infertility).  Weak bones (osteoporosis) and broken bones (fractures).  If you have coughing, wheezing, and shortness of breath, those symptoms may get better when you quit. You may also get sick less often. If you are pregnant, quitting smoking can help to lower your chances of having a baby of low birth weight. What can I do to help me quit smoking? Talk with your doctor about what can help you quit smoking. Some things you can do (strategies) include:  Quitting smoking totally, instead of slowly cutting back how much you smoke over a period of time.  Going to in-person counseling. You are more likely to quit if you go to many counseling sessions.  Using resources and support systems, such as: ? Online chats with a counselor. ? Phone quitlines. ? Printed self-help materials. ? Support groups or group counseling. ? Text messaging programs. ? Mobile phone apps or applications.  Taking medicines. Some of these medicines may have nicotine in them. If you are pregnant or breastfeeding, do not take any medicines to quit smoking unless your doctor says it is okay. Talk with your doctor about counseling or other things that can help you.  Talk with your doctor about using more than one strategy at the same time, such as taking medicines while you are also going to in-person counseling. This can help make  quitting easier. What things can I do to make it easier to quit? Quitting smoking might feel very hard at first, but there is a lot that you can do to make it easier. Take these steps:  Talk to your family and friends. Ask them to support and encourage you.  Call phone quitlines, reach out to support groups, or work with a counselor.  Ask people who smoke to not smoke around you.  Avoid places that make you want (trigger) to smoke, such as: ? Bars. ? Parties. ? Smoke-break areas at work.  Spend time with people who do not smoke.  Lower the stress in your life. Stress can make you want to smoke. Try these things to help your stress: ? Getting regular exercise. ? Deep-breathing exercises. ? Yoga. ? Meditating. ? Doing a body scan. To do this, close your eyes, focus on one area of your body at a time from head to toe, and notice which parts of your body are tense. Try to relax the muscles in those areas.  Download or buy apps on your mobile phone or tablet that can help you stick to your quit plan. There are many free apps, such as QuitGuide from the CDC (Centers for Disease Control and Prevention). You can find more support from smokefree.gov and other websites.  This information is not intended to replace advice given to you by your health care provider. Make sure you discuss any questions you have with your health care provider. Document Released: 10/10/2009 Document   Revised: 08/11/2016 Document Reviewed: 04/30/2015 Elsevier Interactive Patient Education  2018 Elsevier Inc.  

## 2017-12-23 ENCOUNTER — Encounter: Payer: Self-pay | Admitting: Internal Medicine

## 2017-12-23 ENCOUNTER — Ambulatory Visit (INDEPENDENT_AMBULATORY_CARE_PROVIDER_SITE_OTHER): Payer: BLUE CROSS/BLUE SHIELD | Admitting: Internal Medicine

## 2017-12-23 ENCOUNTER — Other Ambulatory Visit (HOSPITAL_COMMUNITY)
Admission: RE | Admit: 2017-12-23 | Discharge: 2017-12-23 | Disposition: A | Discharge status: Home / Self Care | Payer: BLUE CROSS/BLUE SHIELD | Source: Ambulatory Visit | Attending: Internal Medicine | Admitting: Internal Medicine

## 2017-12-23 ENCOUNTER — Encounter: Payer: BLUE CROSS/BLUE SHIELD | Admitting: Internal Medicine

## 2017-12-23 VITALS — BP 140/98 | HR 97 | Temp 98.4°F | Ht 63.5 in | Wt 211.0 lb

## 2017-12-23 DIAGNOSIS — Z87898 Personal history of other specified conditions: Secondary | ICD-10-CM | POA: Diagnosis not present

## 2017-12-23 DIAGNOSIS — Z113 Encounter for screening for infections with a predominantly sexual mode of transmission: Secondary | ICD-10-CM | POA: Diagnosis not present

## 2017-12-23 DIAGNOSIS — F419 Anxiety disorder, unspecified: Secondary | ICD-10-CM | POA: Insufficient documentation

## 2017-12-23 DIAGNOSIS — F1411 Cocaine abuse, in remission: Secondary | ICD-10-CM

## 2017-12-23 DIAGNOSIS — Z13220 Encounter for screening for lipoid disorders: Secondary | ICD-10-CM | POA: Diagnosis not present

## 2017-12-23 DIAGNOSIS — Z1231 Encounter for screening mammogram for malignant neoplasm of breast: Secondary | ICD-10-CM | POA: Diagnosis not present

## 2017-12-23 DIAGNOSIS — Z131 Encounter for screening for diabetes mellitus: Secondary | ICD-10-CM | POA: Diagnosis not present

## 2017-12-23 DIAGNOSIS — F32A Depression, unspecified: Secondary | ICD-10-CM | POA: Insufficient documentation

## 2017-12-23 DIAGNOSIS — I10 Essential (primary) hypertension: Secondary | ICD-10-CM

## 2017-12-23 DIAGNOSIS — Z0000 Encounter for general adult medical examination without abnormal findings: Secondary | ICD-10-CM

## 2017-12-23 DIAGNOSIS — Z1239 Encounter for other screening for malignant neoplasm of breast: Secondary | ICD-10-CM

## 2017-12-23 DIAGNOSIS — M950 Acquired deformity of nose: Secondary | ICD-10-CM | POA: Diagnosis not present

## 2017-12-23 DIAGNOSIS — F411 Generalized anxiety disorder: Secondary | ICD-10-CM

## 2017-12-23 LAB — CBC
HCT: 41.9 % (ref 36.0–46.0)
Hemoglobin: 13.9 g/dL (ref 12.0–15.0)
MCHC: 33.1 g/dL (ref 30.0–36.0)
MCV: 87.1 fl (ref 78.0–100.0)
Platelets: 213 10*3/uL (ref 150.0–400.0)
RBC: 4.82 Mil/uL (ref 3.87–5.11)
RDW: 13.9 % (ref 11.5–15.5)
WBC: 6.7 10*3/uL (ref 4.0–10.5)

## 2017-12-23 LAB — LIPID PANEL
Cholesterol: 223 mg/dL — ABNORMAL HIGH (ref 0–200)
HDL: 28.6 mg/dL — ABNORMAL LOW (ref >39.00)
NonHDL: 194.52
Total CHOL/HDL Ratio: 8
Triglycerides: 262 mg/dL — ABNORMAL HIGH (ref 0.0–149.0)
VLDL: 52.4 mg/dL — ABNORMAL HIGH (ref 0.0–40.0)

## 2017-12-23 LAB — COMPREHENSIVE METABOLIC PANEL
ALT: 18 U/L (ref 0–35)
AST: 17 U/L (ref 0–37)
Albumin: 4.1 g/dL (ref 3.5–5.2)
Alkaline Phosphatase: 61 U/L (ref 39–117)
BUN: 8 mg/dL (ref 6–23)
CO2: 29 mEq/L (ref 19–32)
Calcium: 8.9 mg/dL (ref 8.4–10.5)
Chloride: 103 mEq/L (ref 96–112)
Creatinine, Ser: 0.7 mg/dL (ref 0.40–1.20)
GFR: 97.91 mL/min (ref >60.00)
Glucose, Bld: 105 mg/dL — ABNORMAL HIGH (ref 70–99)
Potassium: 3.8 mEq/L (ref 3.5–5.1)
Sodium: 136 mEq/L (ref 135–145)
Total Bilirubin: 0.3 mg/dL (ref 0.2–1.2)
Total Protein: 7.4 g/dL (ref 6.0–8.3)

## 2017-12-23 LAB — LDL CHOLESTEROL, DIRECT: Direct LDL: 155 mg/dL

## 2017-12-23 LAB — HEMOGLOBIN A1C: Hgb A1c MFr Bld: 6.2 % (ref 4.6–6.5)

## 2017-12-23 MED ORDER — LISINOPRIL 20 MG PO TABS: 20.0000 mg | ORAL_TABLET | Freq: Every day | ORAL | 0 refills | Status: DC

## 2017-12-23 MED ORDER — HYDROXYZINE HCL 10 MG PO TABS
10.0000 mg | ORAL_TABLET | Freq: Three times a day (TID) | ORAL | 0 refills | Status: DC | PRN
Start: 2017-12-23 — End: 2018-01-23

## 2017-12-23 NOTE — Assessment & Plan Note (Signed)
Increase Lisinopril to 20 mg daily CBC and CMET today Reinforced DASH diet and exercise for weight loss

## 2017-12-23 NOTE — Assessment & Plan Note (Signed)
Support offered today Discussed distraction techniques eRx for Hydroxyzine 10 mg daily prn

## 2017-12-23 NOTE — Addendum Note (Signed)
Addended by: Roena MaladyEVONTENNO, Siddhanth Denk Y on: 12/23/2017 03:34 PM   Modules accepted: Orders

## 2017-12-23 NOTE — Patient Instructions (Signed)

## 2017-12-23 NOTE — Progress Notes (Signed)
Subjective:    Patient ID: Jasmine Miller, female    DOB: 09-May-1976, 41 y.o.   MRN: [id number]  HPI  Pt presents to the clinic today for her annual exam. She is also due to follow up HTN and she wants to discuss some anxiety issues.  HTN: Her BP today is 140/98. She is taking Lisinopril daily as prescribed but has not taken it today.   Anxiety: She reports there are times when she gets extremely anxious for no reason. She is not sure what triggers this. She reports her previous PCP gave her a RX for an antianxiety medication that she could take on an as needed basis. She is wondering if this is something I would be able to provide her with today.  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 She would like to see an ENT to make sure nothing further needs to be done.   Flu: never Tetanus: > 10 years ago Pap Smear: never, hysterectomy Mammogram: > 5 years ago Vision Screening: as needed Dentist: as needed  Diet: Exercise:  Review of Systems  Past Medical History:  Diagnosis Date  . Hypertension     Current Outpatient Medications  Medication Sig Dispense Refill  . lisinopril (PRINIVIL,ZESTRIL) 10 MG tablet Take 1 tablet (10 mg total) by mouth daily. 30 tablet 0  . varenicline (CHANTIX STARTING MONTH PAK) 0.5 MG X 11 & 1 MG X 42 tablet Take one 0.5 mg tablet by mouth once daily for 3 days, then increase to one 0.5 mg tablet twice daily for 4 days, then increase to one 1 mg tablet twice daily. 53 tablet 0   No current facility-administered medications for this visit.     Allergies  Allergen Reactions  . Penicillins Anaphylaxis    No family history on file.  Social History   Socioeconomic History  . Marital status: Single    Spouse name: Not on file  . Number of children: Not on file  . Years of education: Not on file  . Highest education level: Not on file  Social Needs  . Financial resource strain: Not on file  . Food  insecurity - worry: Not on file  . Food insecurity - inability: Not on file  . Transportation needs - medical: Not on file  . Transportation needs - non-medical: Not on file  Occupational History  . Not on file  Tobacco Use  . Smoking status: Current Every Day Smoker  . Smokeless tobacco: Never Used  Substance and Sexual Activity  . Alcohol use: Yes  . Drug use: No  . Sexual activity: Not on file  Other Topics Concern  . Not on file  Social History Narrative  . Not on file     Constitutional: Denies fever, malaise, fatigue, headache or abrupt weight changes.  HEENT: Pt reports hole in nasal septum. Denies eye pain, eye redness, ear pain, ringing in the ears, wax buildup, runny nose, nasal congestion, bloody nose, or sore throat. Respiratory: Denies difficulty breathing, shortness of breath, cough or sputum production.   Cardiovascular: Denies chest pain, chest tightness, palpitations or swelling in the hands or feet.  Gastrointestinal: Denies abdominal pain, bloating, constipation, diarrhea or blood in the stool.  GU: Denies urgency, frequency, pain with urination, burning sensation, blood in urine, odor or discharge. Musculoskeletal: Denies decrease in range of motion, difficulty with gait, muscle pain or joint pain and swelling.  Skin: Denies redness, rashes, lesions  or ulcercations.  Neurological: Denies dizziness, difficulty with memory, difficulty with speech or problems with balance and coordination.  Psych: Pt reports anxiety. Denies depression, SI/HI.  No other specific complaints in a complete review of systems (except as listed in HPI above).     Objective:   Physical Exam   BP (!) 140/98   Pulse 97   Temp 98.4 F (36.9 C) (Oral)   Ht 5' 3.5" (1.613 m)   Wt 211 lb (95.7 kg)   SpO2 99%   BMI 36.79 kg/m   Wt Readings from Last 3 Encounters:  12/23/17 211 lb (95.7 kg)  11/26/17 212 lb 12 oz (96.5 kg)  07/29/16 186 lb (84.4 kg)    General: Appears her  stated age,obese in NAD. Skin: Warm, dry and intact. HEENT: Head: normal shape and size; Eyes: sclera white, no icterus, conjunctiva pink, PERRLA and EOMs intact; Ears: Tm's gray and intact, normal light reflex; Throat/Mouth: Teeth present, mucosa pink and moist, no exudate, lesions or ulcerations noted.  Neck:  Neck supple, trachea midline. No masses, lumps or thyromegaly present.  Cardiovascular: Normal rate and rhythm. S1,S2 noted.  No murmur, rubs or gallops noted. No JVD or BLE edema.  Pulmonary/Chest: Normal effort and positive vesicular breath sounds. No respiratory distress. No wheezes, rales or ronchi noted.  Abdomen: Soft and nontender. Normal bowel sounds. No distention or masses noted. Liver, spleen and kidneys non palpable. Pelvic: Normal female anatomy. No cervix. Adnexa non palpable.  Musculoskeletal: Strength 5/5 BUE/BLE.Marland Kitchen No difficulty with gait.  Neurological: Alert and oriented. Cranial nerves II-XII grossly intact. Coordination normal.  Psychiatric: Mood and affect normal. Behavior is normal. Judgment and thought content normal.     BMET    Component Value Date/Time   NA 136 06/17/2016 1046   NA 137 07/17/2014 1237   K 3.8 06/17/2016 1046   K 3.6 07/17/2014 1237   CL 101 06/17/2016 1046   CL 105 07/17/2014 1237   CO2 27 06/17/2016 1046   CO2 28 07/17/2014 1237   GLUCOSE 141 (H) 06/17/2016 1046   GLUCOSE 103 (H) 07/17/2014 1237   BUN 10 06/17/2016 1046   BUN 8 07/17/2014 1237   CREATININE 0.81 06/17/2016 1046   CREATININE 0.72 07/17/2014 1237   CALCIUM 9.9 06/17/2016 1046   CALCIUM 8.5 07/17/2014 1237   GFRNONAA >60 06/17/2016 1046   GFRNONAA >60 07/17/2014 1237   GFRAA >60 06/17/2016 1046   GFRAA >60 07/17/2014 1237    Lipid Panel  No results found for: CHOL, TRIG, HDL, CHOLHDL, VLDL, LDLCALC  CBC    Component Value Date/Time   WBC 13.4 (H) 06/17/2016 1046   RBC 4.77 06/17/2016 1046   HGB 13.9 06/17/2016 1046   HGB 13.5 07/17/2014 1237   HCT 41.0  06/17/2016 1046   HCT 40.2 07/17/2014 1237   PLT 228 06/17/2016 1046   PLT 204 07/17/2014 1237   MCV 85.9 06/17/2016 1046   MCV 89 07/17/2014 1237   MCH 29.2 06/17/2016 1046   MCHC 33.9 06/17/2016 1046   RDW 14.8 (H) 06/17/2016 1046   RDW 13.3 07/17/2014 1237    Hgb A1C No results found for: HGBA1C         Assessment & Plan:   Preventative Health Maintenance:  She declines flu and tetanus booster today Pelvic exam done today- pt requesting to be screened for STD's Mammogram ordered, she will call [facility] to schedule- number provided Encouraged her to consume a balanced diet and exercise regimen Advised her to  see an eye doctor and dentist annually Will check CBC, CMET, Lipid, and A1C today  Nasal Cartilage Problem, Secondary to History of Cocaine Abuse:  Referral to ENT placed  RTC in 3 weeks for follow up HTN Rumaisa Schnetzer, NP

## 2017-12-26 ENCOUNTER — Other Ambulatory Visit: Payer: Self-pay | Admitting: Internal Medicine

## 2017-12-30 LAB — CYTOLOGY - PAP
Bacterial vaginitis: NEGATIVE
Candida vaginitis: NEGATIVE
Chlamydia: NEGATIVE
Diagnosis: NEGATIVE
HPV: NOT DETECTED
Neisseria Gonorrhea: NEGATIVE
Trichomonas: NEGATIVE

## 2017-12-31 ENCOUNTER — Encounter: Payer: Self-pay | Admitting: Internal Medicine

## 2018-01-13 ENCOUNTER — Encounter: Payer: Self-pay | Admitting: Internal Medicine

## 2018-01-13 ENCOUNTER — Ambulatory Visit: Payer: BLUE CROSS/BLUE SHIELD | Admitting: Internal Medicine

## 2018-01-13 DIAGNOSIS — F411 Generalized anxiety disorder: Secondary | ICD-10-CM

## 2018-01-13 DIAGNOSIS — I10 Essential (primary) hypertension: Secondary | ICD-10-CM | POA: Diagnosis not present

## 2018-01-13 MED ORDER — LOSARTAN POTASSIUM-HCTZ 50-12.5 MG PO TABS: 1.0000 | ORAL_TABLET | Freq: Every day | ORAL | 0 refills | Status: DC

## 2018-01-13 NOTE — Progress Notes (Signed)
Subjective:    Patient ID: Jasmine Miller, female    DOB: Aug 03, 1976, 42 y.o.   MRN: [id number]  HPI  Pt presents to the clinic today for 1 month followup.  HTN: Her Lisinopril was increased to 20 mg daily at her last visit. She has been taking the medication as prescribed. Her BP today is 182/106. ECG from 05/2016 reviewed.  Anxiety: Triggered by work stress. At her last visit, she was prescribed Hydroxyzine which she has taken intermittently, which she reports has not really helped. She denies SI/HI.  Review of Systems      Past Medical History:  Diagnosis Date  . Hypertension     Current Outpatient Medications  Medication Sig Dispense Refill  . hydrOXYzine (ATARAX/VISTARIL) 10 MG tablet Take 1 tablet (10 mg total) by mouth 3 (three) times daily as needed. 30 tablet 0  . lisinopril (PRINIVIL,ZESTRIL) 20 MG tablet Take 1 tablet (20 mg total) by mouth daily. 30 tablet 0  . varenicline (CHANTIX STARTING MONTH PAK) 0.5 MG X 11 & 1 MG X 42 tablet Take one 0.5 mg tablet by mouth once daily for 3 days, then increase to one 0.5 mg tablet twice daily for 4 days, then increase to one 1 mg tablet twice daily. (Patient not taking: Reported on 12/23/2017) 53 tablet 0   No current facility-administered medications for this visit.     Allergies  Allergen Reactions  . Penicillins Anaphylaxis    No family history on file.  Social History   Socioeconomic History  . Marital status: Single    Spouse name: Not on file  . Number of children: Not on file  . Years of education: Not on file  . Highest education level: Not on file  Social Needs  . Financial resource strain: Not on file  . Food insecurity - worry: Not on file  . Food insecurity - inability: Not on file  . Transportation needs - medical: Not on file  . Transportation needs - non-medical: Not on file  Occupational History  . Not on file  Tobacco Use  . Smoking status: Current Every Day Smoker  . Smokeless tobacco:  Never Used  Substance and Sexual Activity  . Alcohol use: Yes  . Drug use: No  . Sexual activity: Not on file  Other Topics Concern  . Not on file  Social History Narrative  . Not on file     Constitutional: Denies fever, malaise, fatigue, headache or abrupt weight changes.  Respiratory: Denies difficulty breathing, shortness of breath, cough or sputum production.   Cardiovascular: Denies chest pain, chest tightness, palpitations or swelling in the hands or feet.  Neurological: Denies dizziness, difficulty with memory, difficulty with speech or problems with balance and coordination.  Psych: Pt reports anxiety. Denies depression, SI/HI.  No other specific complaints in a complete review of systems (except as listed in HPI above).  Objective:   Physical Exam  BP (!) 182/106   Pulse 80   Temp 98.1 F (36.7 C) (Oral)   Wt 216 lb (98 kg)   SpO2 97%   BMI 37.66 kg/m  Wt Readings from Last 3 Encounters:  01/13/18 216 lb (98 kg)  12/23/17 211 lb (95.7 kg)  11/26/17 212 lb 12 oz (96.5 kg)    General: Appears their stated age, well developed, well nourished in NAD. Skin: Warm, dry and intact. No rashes, lesions or ulcerations noted. HEENT: Head: normal shape and size; Eyes: sclera white, no icterus, conjunctiva pink, PERRLA and  EOMs intact; Ears: Tm's gray and intact, normal light reflex; Nose: mucosa pink and moist, septum midline; Throat/Mouth: Teeth present, mucosa pink and moist, no exudate, lesions or ulcerations noted.  Neck:  Neck supple, trachea midline. No masses, lumps or thyromegaly present.  Cardiovascular: Normal rate and rhythm. S1,S2 noted.  No murmur, rubs or gallops noted. No JVD or BLE edema. No carotid bruits noted. Pulmonary/Chest: Normal effort and positive vesicular breath sounds. No respiratory distress. No wheezes, rales or ronchi noted.  Abdomen: Soft and nontender. Normal bowel sounds. No distention or masses noted. Liver, spleen and kidneys non  palpable. Musculoskeletal: Normal range of motion. No signs of joint swelling. No difficulty with gait.  Neurological: Alert and oriented. Cranial nerves II-XII grossly intact. Coordination normal.  Psychiatric: Mood and affect normal. Behavior is normal. Judgment and thought content normal.   EKG:  BMET    Component Value Date/Time   NA 136 12/23/2017 1230   NA 137 07/17/2014 1237   K 3.8 12/23/2017 1230   K 3.6 07/17/2014 1237   CL 103 12/23/2017 1230   CL 105 07/17/2014 1237   CO2 29 12/23/2017 1230   CO2 28 07/17/2014 1237   GLUCOSE 105 (H) 12/23/2017 1230   GLUCOSE 103 (H) 07/17/2014 1237   BUN 8 12/23/2017 1230   BUN 8 07/17/2014 1237   CREATININE 0.70 12/23/2017 1230   CREATININE 0.72 07/17/2014 1237   CALCIUM 8.9 12/23/2017 1230   CALCIUM 8.5 07/17/2014 1237   GFRNONAA >60 06/17/2016 1046   GFRNONAA >60 07/17/2014 1237   GFRAA >60 06/17/2016 1046   GFRAA >60 07/17/2014 1237    Lipid Panel     Component Value Date/Time   CHOL 223 (H) 12/23/2017 1230   TRIG 262.0 (H) 12/23/2017 1230   HDL 28.60 (L) 12/23/2017 1230   CHOLHDL 8 12/23/2017 1230   VLDL 52.4 (H) 12/23/2017 1230    CBC    Component Value Date/Time   WBC 6.7 12/23/2017 1230   RBC 4.82 12/23/2017 1230   HGB 13.9 12/23/2017 1230   HGB 13.5 07/17/2014 1237   HCT 41.9 12/23/2017 1230   HCT 40.2 07/17/2014 1237   PLT 213.0 12/23/2017 1230   PLT 204 07/17/2014 1237   MCV 87.1 12/23/2017 1230   MCV 89 07/17/2014 1237   MCH 29.2 06/17/2016 1046   MCHC 33.1 12/23/2017 1230   RDW 13.9 12/23/2017 1230   RDW 13.3 07/17/2014 1237    Hgb A1C Lab Results  Component Value Date   HGBA1C 6.2 12/23/2017            Assessment & Plan:

## 2018-01-13 NOTE — Assessment & Plan Note (Signed)
Uncontrolled D/C Lisinopril eRx for Losartan HCT Reinforced DASH die and exercise for weight loss  RTC in 3 weeks for BP check

## 2018-01-13 NOTE — Patient Instructions (Signed)

## 2018-01-13 NOTE — Assessment & Plan Note (Signed)
Support offered today She declines SSRI therapy or referral for therapy Continue Hydroxyzine for now

## 2018-01-19 ENCOUNTER — Other Ambulatory Visit: Payer: Self-pay | Admitting: Internal Medicine

## 2018-01-19 DIAGNOSIS — I10 Essential (primary) hypertension: Secondary | ICD-10-CM

## 2018-01-23 ENCOUNTER — Other Ambulatory Visit: Payer: Self-pay | Admitting: Internal Medicine

## 2018-01-23 DIAGNOSIS — F411 Generalized anxiety disorder: Secondary | ICD-10-CM

## 2018-01-25 NOTE — Telephone Encounter (Signed)
Last filled 12/23/17... Please advise

## 2018-02-03 ENCOUNTER — Encounter: Payer: Self-pay | Admitting: Internal Medicine

## 2018-02-03 ENCOUNTER — Ambulatory Visit: Payer: BLUE CROSS/BLUE SHIELD | Admitting: Internal Medicine

## 2018-02-03 VITALS — BP 136/94 | HR 88 | Temp 98.3°F | Wt 214.0 lb

## 2018-02-03 DIAGNOSIS — I10 Essential (primary) hypertension: Secondary | ICD-10-CM

## 2018-02-03 DIAGNOSIS — R635 Abnormal weight gain: Secondary | ICD-10-CM | POA: Diagnosis not present

## 2018-02-03 MED ORDER — LOSARTAN POTASSIUM-HCTZ 100-25 MG PO TABS: 1.0000 | ORAL_TABLET | Freq: Every day | ORAL | 2 refills | Status: DC

## 2018-02-03 NOTE — Progress Notes (Signed)
Subjective:    Patient ID: Jasmine Miller, female    DOB: 12/17/1976, 42 y.o.   MRN: [id number]  HPI  Pt presents to the clinic today for 3 week follow up HTN. At her last visit, her Lisinopril was d/c'd and she was started on Losartan HCT. She has been taking the medication as directed and denies adverse side effects. Her BP today is136/94.  She is also very concerned about her weight. She reports she has never been this heavy in her life. Her weight today is 214 lbs with a BMI of 37.31. She reports she does not overeat. She consumes protein shakes for breakfast, meat and salad for dinner. She does only eat 2 x day due to her work schedule. She does not snack. She is not active.  Review of Systems      Past Medical History:  Diagnosis Date  . Hypertension     Current Outpatient Medications  Medication Sig Dispense Refill  . hydrOXYzine (ATARAX/VISTARIL) 10 MG tablet TAKE 1 TABLET BY MOUTH THREE TIMES A DAY AS NEEDED 30 tablet 0  . lisinopril (PRINIVIL,ZESTRIL) 20 MG tablet Take 1 tablet (20 mg total) by mouth daily. 30 tablet 0  . losartan-hydrochlorothiazide (HYZAAR) 50-12.5 MG tablet Take 1 tablet by mouth daily. 30 tablet 0  . varenicline (CHANTIX STARTING MONTH PAK) 0.5 MG X 11 & 1 MG X 42 tablet Take one 0.5 mg tablet by mouth once daily for 3 days, then increase to one 0.5 mg tablet twice daily for 4 days, then increase to one 1 mg tablet twice daily. (Patient not taking: Reported on 01/13/2018) 53 tablet 0   No current facility-administered medications for this visit.     Allergies  Allergen Reactions  . Penicillins Anaphylaxis    No family history on file.  Social History   Socioeconomic History  . Marital status: Single    Spouse name: Not on file  . Number of children: Not on file  . Years of education: Not on file  . Highest education level: Not on file  Social Needs  . Financial resource strain: Not on file  . Food insecurity - worry: Not on file  .  Food insecurity - inability: Not on file  . Transportation needs - medical: Not on file  . Transportation needs - non-medical: Not on file  Occupational History  . Not on file  Tobacco Use  . Smoking status: Current Every Day Smoker  . Smokeless tobacco: Never Used  Substance and Sexual Activity  . Alcohol use: Yes  . Drug use: No  . Sexual activity: Not on file  Other Topics Concern  . Not on file  Social History Narrative  . Not on file     Constitutional: Pt reports weight gain. Denies fever, malaise, fatigue, headache.  Respiratory: Denies difficulty breathing, shortness of breath, cough or sputum production.   Cardiovascular: Denies chest pain, chest tightness, palpitations or swelling in the hands or feet.  Neurological: Denies dizziness, difficulty with memory, difficulty with speech or problems with balance and coordination.    No other specific complaints in a complete review of systems (except as listed in HPI above).  Objective:   Physical Exam   BP (!) 136/94   Pulse 88   Temp 98.3 F (36.8 C) (Oral)   Wt 214 lb (97.1 kg)   SpO2 98%   BMI 37.31 kg/m  Wt Readings from Last 3 Encounters:  02/03/18 214 lb (97.1 kg)  01/13/18 216 lb (  98 kg)  12/23/17 211 lb (95.7 kg)    General: Appears her stated age, obese in NAD. Cardiovascular: Normal rate and rhythm. S1,S2 noted.  No murmur, rubs or gallops noted.  Pulmonary/Chest: Normal effort and positive vesicular breath sounds. No respiratory distress. No wheezes, rales or ronchi noted.  Neurological: Alert and oriented.    BMET    Component Value Date/Time   NA 136 12/23/2017 1230   NA 137 07/17/2014 1237   K 3.8 12/23/2017 1230   K 3.6 07/17/2014 1237   CL 103 12/23/2017 1230   CL 105 07/17/2014 1237   CO2 29 12/23/2017 1230   CO2 28 07/17/2014 1237   GLUCOSE 105 (H) 12/23/2017 1230   GLUCOSE 103 (H) 07/17/2014 1237   BUN 8 12/23/2017 1230   BUN 8 07/17/2014 1237   CREATININE 0.70 12/23/2017 1230     CREATININE 0.72 07/17/2014 1237   CALCIUM 8.9 12/23/2017 1230   CALCIUM 8.5 07/17/2014 1237   GFRNONAA >60 06/17/2016 1046   GFRNONAA >60 07/17/2014 1237   GFRAA >60 06/17/2016 1046   GFRAA >60 07/17/2014 1237    Lipid Panel     Component Value Date/Time   CHOL 223 (H) 12/23/2017 1230   TRIG 262.0 (H) 12/23/2017 1230   HDL 28.60 (L) 12/23/2017 1230   CHOLHDL 8 12/23/2017 1230   VLDL 52.4 (H) 12/23/2017 1230    CBC    Component Value Date/Time   WBC 6.7 12/23/2017 1230   RBC 4.82 12/23/2017 1230   HGB 13.9 12/23/2017 1230   HGB 13.5 07/17/2014 1237   HCT 41.9 12/23/2017 1230   HCT 40.2 07/17/2014 1237   PLT 213.0 12/23/2017 1230   PLT 204 07/17/2014 1237   MCV 87.1 12/23/2017 1230   MCV 89 07/17/2014 1237   MCH 29.2 06/17/2016 1046   MCHC 33.1 12/23/2017 1230   RDW 13.9 12/23/2017 1230   RDW 13.3 07/17/2014 1237    Hgb A1C Lab Results  Component Value Date   HGBA1C 6.2 12/23/2017           Assessment & Plan:   Abnormal Weight Gain:  I don't think she needs appetite suppressents.  I think she needs to eat more Discussed Keto diet and increasing exercise  Update me in 2 weeks with your BP readings Nicki Reaper, NP

## 2018-02-04 NOTE — Patient Instructions (Signed)

## 2018-02-04 NOTE — Assessment & Plan Note (Signed)
Better but still not at goal Increase Losartan to 100-25 mg daily Reinforced DASH diet and exercise for weight loss

## 2018-02-08 ENCOUNTER — Encounter: Payer: Self-pay | Admitting: Internal Medicine

## 2018-02-09 ENCOUNTER — Other Ambulatory Visit: Payer: Self-pay | Admitting: Internal Medicine

## 2018-05-15 ENCOUNTER — Other Ambulatory Visit: Payer: Self-pay | Admitting: Internal Medicine

## 2018-08-18 ENCOUNTER — Other Ambulatory Visit: Payer: Self-pay | Admitting: Internal Medicine

## 2018-08-31 ENCOUNTER — Telehealth: Payer: Self-pay

## 2018-08-31 NOTE — Telephone Encounter (Signed)
Last annual exam 12/23/17.

## 2018-08-31 NOTE — Telephone Encounter (Signed)
Copied from CRM [id number]. Topic: Referral - Question >> Aug 31, 2018  1:40 PM Burchel, Abbi R wrote: Pt called to request  that Nicki Reaper change the order for her mammogram.  Pt states that when she called to schedule it they told her she needed a diagnostic mammogram, bc she mentioned that she felt "something" in her left breast.  Please advise, [phone].

## 2018-08-31 NOTE — Telephone Encounter (Signed)
She needs an OV for evaluation, can change order at that time.

## 2018-09-02 NOTE — Telephone Encounter (Signed)
Pt has appt 09/08/18 for eval

## 2018-09-08 ENCOUNTER — Ambulatory Visit: Payer: BLUE CROSS/BLUE SHIELD | Admitting: Internal Medicine

## 2018-09-08 ENCOUNTER — Encounter: Payer: Self-pay | Admitting: Internal Medicine

## 2018-09-08 VITALS — BP 128/86 | HR 98 | Temp 98.2°F | Wt 210.0 lb

## 2018-09-08 DIAGNOSIS — N6452 Nipple discharge: Secondary | ICD-10-CM | POA: Diagnosis not present

## 2018-09-08 DIAGNOSIS — B351 Tinea unguium: Secondary | ICD-10-CM

## 2018-09-08 DIAGNOSIS — F17200 Nicotine dependence, unspecified, uncomplicated: Secondary | ICD-10-CM

## 2018-09-08 MED ORDER — VARENICLINE TARTRATE 0.5 MG X 11 & 1 MG X 42 PO MISC: ORAL | 0 refills | Status: DC

## 2018-09-08 NOTE — Patient Instructions (Signed)
Steps to Quit Smoking Smoking tobacco can be bad for your health. It can also affect almost every organ in your body. Smoking puts you and people around you at risk for many serious long-lasting (chronic) diseases. Quitting smoking is hard, but it is one of the best things that you can do for your health. It is never too late to quit. What are the benefits of quitting smoking? When you quit smoking, you lower your risk for getting serious diseases and conditions. They can include:  Lung cancer or lung disease.  Heart disease.  Stroke.  Heart attack.  Not being able to have children (infertility).  Weak bones (osteoporosis) and broken bones (fractures).  If you have coughing, wheezing, and shortness of breath, those symptoms may get better when you quit. You may also get sick less often. If you are pregnant, quitting smoking can help to lower your chances of having a baby of low birth weight. What can I do to help me quit smoking? Talk with your doctor about what can help you quit smoking. Some things you can do (strategies) include:  Quitting smoking totally, instead of slowly cutting back how much you smoke over a period of time.  Going to in-person counseling. You are more likely to quit if you go to many counseling sessions.  Using resources and support systems, such as: ? Online chats with a counselor. ? Phone quitlines. ? Printed self-help materials. ? Support groups or group counseling. ? Text messaging programs. ? Mobile phone apps or applications.  Taking medicines. Some of these medicines may have nicotine in them. If you are pregnant or breastfeeding, do not take any medicines to quit smoking unless your doctor says it is okay. Talk with your doctor about counseling or other things that can help you.  Talk with your doctor about using more than one strategy at the same time, such as taking medicines while you are also going to in-person counseling. This can help make  quitting easier. What things can I do to make it easier to quit? Quitting smoking might feel very hard at first, but there is a lot that you can do to make it easier. Take these steps:  Talk to your family and friends. Ask them to support and encourage you.  Call phone quitlines, reach out to support groups, or work with a counselor.  Ask people who smoke to not smoke around you.  Avoid places that make you want (trigger) to smoke, such as: ? Bars. ? Parties. ? Smoke-break areas at work.  Spend time with people who do not smoke.  Lower the stress in your life. Stress can make you want to smoke. Try these things to help your stress: ? Getting regular exercise. ? Deep-breathing exercises. ? Yoga. ? Meditating. ? Doing a body scan. To do this, close your eyes, focus on one area of your body at a time from head to toe, and notice which parts of your body are tense. Try to relax the muscles in those areas.  Download or buy apps on your mobile phone or tablet that can help you stick to your quit plan. There are many free apps, such as QuitGuide from the CDC (Centers for Disease Control and Prevention). You can find more support from smokefree.gov and other websites.  This information is not intended to replace advice given to you by your health care provider. Make sure you discuss any questions you have with your health care provider. Document Released: 10/10/2009 Document   Revised: 08/11/2016 Document Reviewed: 04/30/2015 Elsevier Interactive Patient Education  2018 Elsevier Inc.  

## 2018-09-08 NOTE — Progress Notes (Signed)
Subjective:    Patient ID: Jasmine Miller, female    DOB: 1976/04/26, 42 y.o.   MRN: [id number]  HPI  Pt presents to the clinic today with c/o hard area to left breast that is tender to touch. She noticed this 2-3 months ago. She has also noticed some discharge from the nipple. She denies any redness or changes of the skin. She has not noticed any issues with her right breast. She reports her grandmother and several of her aunts have had breast cancer. She never went for her screening mammogram at age 89.  She would also like to try Chantix for smoking cessation.  She also reports fungal infection of bilateral great thumbnails. She has tried topical medications OTC without any relief.  Review of Systems      Past Medical History:  Diagnosis Date  . Hypertension     Current Outpatient Medications  Medication Sig Dispense Refill  . losartan-hydrochlorothiazide (HYZAAR) 100-25 MG tablet TAKE 1 TABLET BY MOUTH EVERY DAY 30 tablet 1  . varenicline (CHANTIX STARTING MONTH PAK) 0.5 MG X 11 & 1 MG X 42 tablet Take one 0.5 mg tablet by mouth once daily for 3 days, then increase to one 0.5 mg tablet twice daily for 4 days, then increase to one 1 mg tablet twice daily. 53 tablet 0   No current facility-administered medications for this visit.     Allergies  Allergen Reactions  . Penicillins Anaphylaxis    No family history on file.  Social History   Socioeconomic History  . Marital status: Single    Spouse name: Not on file  . Number of children: Not on file  . Years of education: Not on file  . Highest education level: Not on file  Occupational History  . Not on file  Social Needs  . Financial resource strain: Not on file  . Food insecurity:    Worry: Not on file    Inability: Not on file  . Transportation needs:    Medical: Not on file    Non-medical: Not on file  Tobacco Use  . Smoking status: Current Every Day Smoker  . Smokeless tobacco: Never Used  Substance  and Sexual Activity  . Alcohol use: Yes  . Drug use: No  . Sexual activity: Not on file  Lifestyle  . Physical activity:    Days per week: Not on file    Minutes per session: Not on file  . Stress: Not on file  Relationships  . Social connections:    Talks on phone: Not on file    Gets together: Not on file    Attends religious service: Not on file    Active member of club or organization: Not on file    Attends meetings of clubs or organizations: Not on file    Relationship status: Not on file  . Intimate partner violence:    Fear of current or ex partner: Not on file    Emotionally abused: Not on file    Physically abused: Not on file    Forced sexual activity: Not on file  Other Topics Concern  . Not on file  Social History Narrative  . Not on file     Constitutional: Denies fever, malaise, fatigue, headache or abrupt weight changes.  Skin: Pt reports hard, tender are of left breast, nipple discharge. Pt reports nail fungus of bilateral thumbs. Denies redness, rashes, lesions or ulcercations.    No other specific complaints in a complete  review of systems (except as listed in HPI above).  Objective:   Physical Exam  BP 128/86   Pulse 98   Temp 98.2 F (36.8 C) (Oral)   Wt 210 lb (95.3 kg)   SpO2 98%   BMI 36.62 kg/m  Wt Readings from Last 3 Encounters:  09/08/18 210 lb (95.3 kg)  02/03/18 214 lb (97.1 kg)  01/13/18 216 lb (98 kg)    General: Appears her stated age, obese in NAD. Skin: Breast symmetrical. Fibrocystic changes noted bilaterally. No definitive mass noted. No discharge expressed from the nipple. Discoloration noted of bilateral thumbnails. Nails separating from nail bed. Cardiovascular: Normal rate and rhythm.  Pulmonary/Chest: Normal effort and positive vesicular breath sounds. No respiratory distress. No wheezes, rales or ronchi noted.    BMET    Component Value Date/Time   NA 136 12/23/2017 1230   NA 137 07/17/2014 1237   K 3.8  12/23/2017 1230   K 3.6 07/17/2014 1237   CL 103 12/23/2017 1230   CL 105 07/17/2014 1237   CO2 29 12/23/2017 1230   CO2 28 07/17/2014 1237   GLUCOSE 105 (H) 12/23/2017 1230   GLUCOSE 103 (H) 07/17/2014 1237   BUN 8 12/23/2017 1230   BUN 8 07/17/2014 1237   CREATININE 0.70 12/23/2017 1230   CREATININE 0.72 07/17/2014 1237   CALCIUM 8.9 12/23/2017 1230   CALCIUM 8.5 07/17/2014 1237   GFRNONAA >60 06/17/2016 1046   GFRNONAA >60 07/17/2014 1237   GFRAA >60 06/17/2016 1046   GFRAA >60 07/17/2014 1237    Lipid Panel     Component Value Date/Time   CHOL 223 (H) 12/23/2017 1230   TRIG 262.0 (H) 12/23/2017 1230   HDL 28.60 (L) 12/23/2017 1230   CHOLHDL 8 12/23/2017 1230   VLDL 52.4 (H) 12/23/2017 1230    CBC    Component Value Date/Time   WBC 6.7 12/23/2017 1230   RBC 4.82 12/23/2017 1230   HGB 13.9 12/23/2017 1230   HGB 13.5 07/17/2014 1237   HCT 41.9 12/23/2017 1230   HCT 40.2 07/17/2014 1237   PLT 213.0 12/23/2017 1230   PLT 204 07/17/2014 1237   MCV 87.1 12/23/2017 1230   MCV 89 07/17/2014 1237   MCH 29.2 06/17/2016 1046   MCHC 33.1 12/23/2017 1230   RDW 13.9 12/23/2017 1230   RDW 13.3 07/17/2014 1237    Hgb A1C Lab Results  Component Value Date   HGBA1C 6.2 12/23/2017           Assessment & Plan:   Nipple Discharge, Left;  Will obtain bilateral diagnostic mammogram and bilateral ultrasounds  Nail Fungus:  CMET today If liver enzymes normal, will send in Terbinafine 250 mg daily x 4 weeks Will repeat liver enzymes at that time Discussed black box warning of liver failure  Smoker, Motivated to Quit:  Chantix sent to pharmacy  Return precautions discussed Nicki Reaperegina Baity, NP

## 2018-09-09 ENCOUNTER — Other Ambulatory Visit: Payer: Self-pay | Admitting: Internal Medicine

## 2018-09-09 DIAGNOSIS — B351 Tinea unguium: Secondary | ICD-10-CM

## 2018-09-09 LAB — COMPREHENSIVE METABOLIC PANEL
ALT: 19 U/L (ref 0–35)
AST: 17 U/L (ref 0–37)
Albumin: 4.1 g/dL (ref 3.5–5.2)
Alkaline Phosphatase: 50 U/L (ref 39–117)
BUN: 12 mg/dL (ref 6–23)
CO2: 27 mEq/L (ref 19–32)
Calcium: 9.9 mg/dL (ref 8.4–10.5)
Chloride: 99 mEq/L (ref 96–112)
Creatinine, Ser: 0.64 mg/dL (ref 0.40–1.20)
GFR: 108.2 mL/min (ref >60.00)
Glucose, Bld: 93 mg/dL (ref 70–99)
Potassium: 3.4 mEq/L — ABNORMAL LOW (ref 3.5–5.1)
Sodium: 137 mEq/L (ref 135–145)
Total Bilirubin: 0.4 mg/dL (ref 0.2–1.2)
Total Protein: 7.5 g/dL (ref 6.0–8.3)

## 2018-09-09 MED ORDER — TERBINAFINE HCL 250 MG PO TABS: 250.0000 mg | ORAL_TABLET | Freq: Every day | ORAL | 0 refills | Status: DC

## 2018-09-22 ENCOUNTER — Ambulatory Visit
Admission: RE | Admit: 2018-09-22 | Discharge: 2018-09-22 | Disposition: A | Discharge status: Home / Self Care | Payer: BLUE CROSS/BLUE SHIELD | Source: Ambulatory Visit | Attending: Internal Medicine | Admitting: Internal Medicine

## 2018-09-22 DIAGNOSIS — N6452 Nipple discharge: Secondary | ICD-10-CM

## 2018-10-04 ENCOUNTER — Encounter: Payer: Self-pay | Admitting: Internal Medicine

## 2018-10-04 ENCOUNTER — Ambulatory Visit: Payer: Self-pay | Admitting: Internal Medicine

## 2018-10-04 VITALS — BP 128/84 | HR 82 | Temp 98.1°F | Wt 210.0 lb

## 2018-10-04 DIAGNOSIS — L509 Urticaria, unspecified: Secondary | ICD-10-CM

## 2018-10-04 DIAGNOSIS — T50905A Adverse effect of unspecified drugs, medicaments and biological substances, initial encounter: Secondary | ICD-10-CM

## 2018-10-04 MED ORDER — METHYLPREDNISOLONE ACETATE 80 MG/ML IJ SUSP
80.0000 mg | Freq: Once | INTRAMUSCULAR | Status: AC
  Administered 2018-10-04: 80 mg via INTRAMUSCULAR

## 2018-10-04 NOTE — Progress Notes (Signed)
Subjective:    Patient ID: Jasmine Miller, female    DOB: 03/04/1976, 42 y.o.   MRN: [id number]  HPI  Pt presents to the clinic today with c/o rash. She noticed this yesterday. The rash is all over her body. The rash itches. She is having swelling of her lip, but not her tongue and throat. She thinks she may be having an allergic reaction to Chantix. She started 2 tabs on Sunday and the rash started Monday. She has taken Benadryl OTC with some improvement.  Review of Systems      Past Medical History:  Diagnosis Date  . Hypertension     Current Outpatient Medications  Medication Sig Dispense Refill  . losartan-hydrochlorothiazide (HYZAAR) 100-25 MG tablet TAKE 1 TABLET BY MOUTH EVERY DAY 30 tablet 1  . terbinafine (LAMISIL) 250 MG tablet Take 1 tablet (250 mg total) by mouth daily. 30 tablet 0  . varenicline (CHANTIX STARTING MONTH PAK) 0.5 MG X 11 & 1 MG X 42 tablet Take one 0.5 mg tablet by mouth once daily for 3 days, then increase to one 0.5 mg tablet twice daily for 4 days, then increase to one 1 mg tablet twice daily. 53 tablet 0   No current facility-administered medications for this visit.     Allergies  Allergen Reactions  . Penicillins Anaphylaxis    Family History  Problem Relation Age of Onset  . Breast cancer Maternal Aunt   . Breast cancer Maternal Grandmother   . Breast cancer Maternal Aunt     Social History   Socioeconomic History  . Marital status: Single    Spouse name: Not on file  . Number of children: Not on file  . Years of education: Not on file  . Highest education level: Not on file  Occupational History  . Not on file  Social Needs  . Financial resource strain: Not on file  . Food insecurity:    Worry: Not on file    Inability: Not on file  . Transportation needs:    Medical: Not on file    Non-medical: Not on file  Tobacco Use  . Smoking status: Current Every Day Smoker  . Smokeless tobacco: Never Used  Substance and Sexual  Activity  . Alcohol use: Yes  . Drug use: No  . Sexual activity: Not on file  Lifestyle  . Physical activity:    Days per week: Not on file    Minutes per session: Not on file  . Stress: Not on file  Relationships  . Social connections:    Talks on phone: Not on file    Gets together: Not on file    Attends religious service: Not on file    Active member of club or organization: Not on file    Attends meetings of clubs or organizations: Not on file    Relationship status: Not on file  . Intimate partner violence:    Fear of current or ex partner: Not on file    Emotionally abused: Not on file    Physically abused: Not on file    Forced sexual activity: Not on file  Other Topics Concern  . Not on file  Social History Narrative  . Not on file     Constitutional: Denies fever, malaise, fatigue, headache or abrupt weight changes.  Respiratory: Denies difficulty breathing, shortness of breath, cough or sputum production.   Cardiovascular: Denies chest pain, chest tightness, palpitations or swelling in the hands or feet.  Skin: Pt reports hives. Denies ulcercations.    No other specific complaints in a complete review of systems (except as listed in HPI above).  Objective:   Physical Exam   BP 128/84   Pulse 82   Temp 98.1 F (36.7 C) (Oral)   Wt 210 lb (95.3 kg)   SpO2 98%   BMI 36.62 kg/m  Wt Readings from Last 3 Encounters:  10/04/18 210 lb (95.3 kg)  09/08/18 210 lb (95.3 kg)  02/03/18 214 lb (97.1 kg)    General: Appears her stated age, obese in NAD. Skin: Warm, dry and intact. Hives noted on upper and lower extremities, abdomen. HEENT: Head: normal shape and size; Throat/Mouth: Teeth present, mucosa pink and moist, no exudate, lesions or ulcerations noted.  Cardiovascular: Normal rate and rhythm. S1,S2 noted.  No murmur, rubs or gallops noted. Pulmonary/Chest: Normal effort and positive vesicular breath sounds. No respiratory distress. No wheezes, rales or  ronchi noted.  Neurological: Alert and oriented.    BMET    Component Value Date/Time   NA 137 09/08/2018 1529   NA 137 07/17/2014 1237   K 3.4 (L) 09/08/2018 1529   K 3.6 07/17/2014 1237   CL 99 09/08/2018 1529   CL 105 07/17/2014 1237   CO2 27 09/08/2018 1529   CO2 28 07/17/2014 1237   GLUCOSE 93 09/08/2018 1529   GLUCOSE 103 (H) 07/17/2014 1237   BUN 12 09/08/2018 1529   BUN 8 07/17/2014 1237   CREATININE 0.64 09/08/2018 1529   CREATININE 0.72 07/17/2014 1237   CALCIUM 9.9 09/08/2018 1529   CALCIUM 8.5 07/17/2014 1237   GFRNONAA >60 06/17/2016 1046   GFRNONAA >60 07/17/2014 1237   GFRAA >60 06/17/2016 1046   GFRAA >60 07/17/2014 1237    Lipid Panel     Component Value Date/Time   CHOL 223 (H) 12/23/2017 1230   TRIG 262.0 (H) 12/23/2017 1230   HDL 28.60 (L) 12/23/2017 1230   CHOLHDL 8 12/23/2017 1230   VLDL 52.4 (H) 12/23/2017 1230    CBC    Component Value Date/Time   WBC 6.7 12/23/2017 1230   RBC 4.82 12/23/2017 1230   HGB 13.9 12/23/2017 1230   HGB 13.5 07/17/2014 1237   HCT 41.9 12/23/2017 1230   HCT 40.2 07/17/2014 1237   PLT 213.0 12/23/2017 1230   PLT 204 07/17/2014 1237   MCV 87.1 12/23/2017 1230   MCV 89 07/17/2014 1237   MCH 29.2 06/17/2016 1046   MCHC 33.1 12/23/2017 1230   RDW 13.9 12/23/2017 1230   RDW 13.3 07/17/2014 1237    Hgb A1C Lab Results  Component Value Date   HGBA1C 6.2 12/23/2017           Assessment & Plan:   Hives, Allergic Reaction to Medication:  Continue Benadryl 25-50 mg every 8 hours as needed 80 mg Depo IM today If no improvement, consider oral Pred Taper  Return precautions discussed Nicki Reaper, NP

## 2018-10-04 NOTE — Patient Instructions (Signed)
Hives Hives (urticaria) are itchy, red, swollen areas on your skin. Hives can show up on any part of your body, and they can vary in size. They can be as small as the tip of a pen or much larger. Hives often fade within 24 hours (acute hives). In other cases, new hives show up after old ones fade. This can continue for many days or weeks (chronic hives). Hives are caused by your body's reaction to an irritant or to something that you are allergic to (trigger). You can get hives right after being around a trigger or hours later. Hives do not spread from person to person (are not contagious). Hives may get worse if you scratch them, if you exercise, or if you have worries (emotional stress). Follow these instructions at home: Medicines  Take or apply over-the-counter and prescription medicines only as told by your doctor.  If you were prescribed an antibiotic medicine, use it as told by your doctor. Do not stop taking the antibiotic even if you start to feel better. Skin Care  Apply cool, wet cloths (cool compresses) to the itchy, red, swollen areas.  Do not scratch your skin. Do not rub your skin. General instructions  Do not take hot showers or baths. This can make itching worse.  Do not wear tight clothes.  Use sunscreen and wear clothing that covers your skin when you are outside.  Avoid any triggers that cause your hives. Keep a journal to help you keep track of what causes your hives. Write down: ? What medicines you take. ? What you eat and drink. ? What products you use on your skin.  Keep all follow-up visits as told by your doctor. This is important. Contact a doctor if:  Your symptoms are not better with medicine.  Your joints are painful or swollen. Get help right away if:  You have a fever.  You have belly pain.  Your tongue or lips are swollen.  Your eyelids are swollen.  Your chest or throat feels tight.  You have trouble breathing or swallowing. These  symptoms may be an emergency. Do not wait to see if the symptoms will go away. Get medical help right away. Call your local emergency services (911 in the [place] Do not drive yourself to the hospital. This information is not intended to replace advice given to you by your health care provider. Make sure you discuss any questions you have with your health care provider. Document Released: 09/22/2008 Document Revised: 05/21/2016 Document Reviewed: 10/02/2015 Elsevier Interactive Patient Education  2018 Elsevier Inc.  

## 2018-10-04 NOTE — Addendum Note (Signed)
Addended by: Roena Malady on: 10/04/2018 04:02 PM   Modules accepted: Orders

## 2018-10-05 ENCOUNTER — Encounter: Payer: Self-pay | Admitting: Internal Medicine

## 2018-10-13 ENCOUNTER — Other Ambulatory Visit: Payer: Self-pay | Admitting: Internal Medicine

## 2018-11-14 ENCOUNTER — Telehealth: Payer: Self-pay

## 2018-11-14 MED ORDER — LOSARTAN POTASSIUM-HCTZ 100-25 MG PO TABS: 1.0000 | ORAL_TABLET | Freq: Every day | ORAL | 1 refills | Status: DC

## 2018-11-14 NOTE — Telephone Encounter (Signed)
Patient's last fill:  #30 on 10/14/18 with no additional refills.   Not sure why only given 1 month supply at that time. Last OV on 10/04/18 but for acute problem only.  Last Visit in regards to HTN management was 12/2017.  No future appointments have been scheduled.   Will forward this to PCP/CMA to review before filling in case there is a reason for short term fill supply.  Thanks.

## 2018-11-14 NOTE — Telephone Encounter (Signed)
Patient called in to [facility] yesterday requesting a refill on her Losartan.  Nothing further was addressed by the on call and was forwarded to the office this am.

## 2018-11-16 NOTE — Telephone Encounter (Signed)
Ok to send in Losartan 100 mg 1 tab PO daily and HCTZ 12.5 mg 1 tab PO daily, #30, each, 2 refills. Needs to schedule annual exam for January

## 2018-11-16 NOTE — Telephone Encounter (Signed)
Pt called pharmacy and was told med is on indefinite backorder. She will need 2 meds that make this up or another presciption for another comparable medication

## 2018-11-17 MED ORDER — LOSARTAN POTASSIUM 100 MG PO TABS: 100.0000 mg | ORAL_TABLET | Freq: Every day | ORAL | 1 refills | Status: DC

## 2018-11-17 MED ORDER — HYDROCHLOROTHIAZIDE 25 MG PO TABS: 25.0000 mg | ORAL_TABLET | Freq: Every day | ORAL | 1 refills | Status: DC

## 2018-11-17 NOTE — Telephone Encounter (Signed)
Rx sent through e-scribe  

## 2018-11-17 NOTE — Addendum Note (Signed)
Addended by: Roena MaladyEVONTENNO, Talin Feister Y on: 11/17/2018 08:40 AM   Modules accepted: Orders

## 2019-01-12 ENCOUNTER — Telehealth: Payer: Self-pay | Admitting: Internal Medicine

## 2019-01-12 ENCOUNTER — Ambulatory Visit (INDEPENDENT_AMBULATORY_CARE_PROVIDER_SITE_OTHER): Payer: 59 | Admitting: Family Medicine

## 2019-01-12 ENCOUNTER — Encounter: Payer: Self-pay | Admitting: Family Medicine

## 2019-01-12 DIAGNOSIS — J069 Acute upper respiratory infection, unspecified: Secondary | ICD-10-CM

## 2019-01-12 MED ORDER — ALBUTEROL SULFATE HFA 108 (90 BASE) MCG/ACT IN AERS: 1.0000 | INHALATION_SPRAY | Freq: Four times a day (QID) | RESPIRATORY_TRACT | 0 refills | Status: DC | PRN

## 2019-01-12 NOTE — Telephone Encounter (Signed)
Called to schedule an appointment per [facility] request

## 2019-01-12 NOTE — Progress Notes (Signed)
Sx started about 2 days ago.  ST.  Chest is congested.  Fever last night, better with tylenol.  R ear pain.  No L ear pain.  Cough.  Some sputum.  Some aches.  Fatigued. Sick exposures at work but not known flu cases.    Encouraged smoking cessation.    Out of work today.    Meds, vitals, and allergies reviewed.   ROS: Per HPI unless specifically indicated in ROS section   GEN: nad, alert and oriented HEENT: mucous membranes moist, tm w/o erythema, nasal exam w/o erythema, clear discharge noted,  OP with cobblestoning NECK: supple w/o LA CV: rrr.   PULM: ctab, no inc wob EXT: no edema SKIN: no acute rash R max sinus slightly ttp.

## 2019-01-12 NOTE — Patient Instructions (Addendum)
Presumed non-flu virus.   Albuterol if needed for cough.  Rest and fluids.  Tylenol if needed.  Update [place] as needed.  Take care.  Glad to see you.  Out of work for now.

## 2019-01-15 DIAGNOSIS — J069 Acute upper respiratory infection, unspecified: Secondary | ICD-10-CM | POA: Insufficient documentation

## 2019-01-15 NOTE — Assessment & Plan Note (Signed)
Presumed non-flu virus.   Albuterol if needed for cough.  Rest and fluids.  Tylenol if needed.  Update [place] as needed.  Out of work for now.  She agrees with plan.

## 2019-01-24 ENCOUNTER — Other Ambulatory Visit: Payer: Self-pay | Admitting: Internal Medicine

## 2019-02-13 ENCOUNTER — Other Ambulatory Visit: Payer: Self-pay | Admitting: Family Medicine

## 2019-02-20 ENCOUNTER — Other Ambulatory Visit: Payer: Self-pay | Admitting: Internal Medicine

## 2019-04-03 ENCOUNTER — Other Ambulatory Visit: Payer: Self-pay | Admitting: Internal Medicine

## 2019-04-03 NOTE — Telephone Encounter (Signed)
Pt needs to schedule f/u via [email or web address]

## 2019-04-06 ENCOUNTER — Encounter: Payer: Self-pay | Admitting: Internal Medicine

## 2019-04-06 ENCOUNTER — Other Ambulatory Visit: Payer: Self-pay

## 2019-04-06 ENCOUNTER — Ambulatory Visit (INDEPENDENT_AMBULATORY_CARE_PROVIDER_SITE_OTHER): Payer: 59 | Admitting: Internal Medicine

## 2019-04-06 DIAGNOSIS — R609 Edema, unspecified: Secondary | ICD-10-CM | POA: Diagnosis not present

## 2019-04-06 DIAGNOSIS — K219 Gastro-esophageal reflux disease without esophagitis: Secondary | ICD-10-CM

## 2019-04-06 DIAGNOSIS — R140 Abdominal distension (gaseous): Secondary | ICD-10-CM | POA: Diagnosis not present

## 2019-04-06 DIAGNOSIS — I10 Essential (primary) hypertension: Secondary | ICD-10-CM | POA: Diagnosis not present

## 2019-04-06 DIAGNOSIS — K5900 Constipation, unspecified: Secondary | ICD-10-CM

## 2019-04-06 DIAGNOSIS — R102 Pelvic and perineal pain: Secondary | ICD-10-CM | POA: Diagnosis not present

## 2019-04-06 DIAGNOSIS — G2581 Restless legs syndrome: Secondary | ICD-10-CM | POA: Diagnosis not present

## 2019-04-06 MED ORDER — FUROSEMIDE 20 MG PO TABS: 20.0000 mg | ORAL_TABLET | Freq: Every day | ORAL | 3 refills | Status: DC

## 2019-04-06 MED ORDER — LOSARTAN POTASSIUM 100 MG PO TABS: 100.0000 mg | ORAL_TABLET | Freq: Every day | ORAL | 1 refills | Status: DC

## 2019-04-06 NOTE — Progress Notes (Signed)
Subjective:    Patient ID: Jasmine Miller, female    DOB: 1976/01/17, 43 y.o.   MRN: [id number]  HPI  Pt presents to the clinic today with multiple complaints.  1- She reports pelvic pain and pressure. This started 2-3 months ago. It was intermittent but has become constant over the last 2-3 months. She describes the pain as achy/throbbing. She reports associated reflux and bloating. She has abdominal pain with bowel movements. She has had some intermittent constipation, but denies nausea, vomiting or blood in her stool. She denies urgency, frequency, dysuria, blood in her urine, vaginal irritation, discharge, odor or abnormal bleeding  2- She would also like to follow up HTN. She is taking Losartan HCT as prescribed. She denies adverse side effects but has noticed some intermittent swelling in her legs which is not painful, but irritating. Her BP today is 137/88 but she has not taken her BP today. She denies headaches, dizziness or visual changes. ECG from 05/2016 reviewed.  3- She also reports bilateral leg pain. She reports this started a few months ago. She reports her legs feel heavy, numbness, tingling and tight. The pain is worse at night. She denies any back pain at this time. She has tried Ibuprofen and muscle relaxers with some relief.  Review of Systems      Past Medical History:  Diagnosis Date  . Hypertension     Current Outpatient Medications  Medication Sig Dispense Refill  . losartan-hydrochlorothiazide (HYZAAR) 100-25 MG tablet TAKE 1 TABLET BY MOUTH DAILY. MUST SCHEDULE ANNUAL EXAM 30 tablet 0  . VENTOLIN HFA 108 (90 Base) MCG/ACT inhaler INHALE 1-2 PUFFS INTO THE LUNGS EVERY 6 (SIX) HOURS AS NEEDED (FOR COUGH) 18 Inhaler 0   No current facility-administered medications for this visit.     Allergies  Allergen Reactions  . Chantix [Varenicline Tartrate] Swelling    Lip swelling and rash  . Penicillins Anaphylaxis    Family History  Problem Relation Age of  Onset  . Breast cancer Maternal Aunt   . Breast cancer Maternal Grandmother   . Breast cancer Maternal Aunt     Social History   Socioeconomic History  . Marital status: Single    Spouse name: Not on file  . Number of children: Not on file  . Years of education: Not on file  . Highest education level: Not on file  Occupational History  . Not on file  Social Needs  . Financial resource strain: Not on file  . Food insecurity:    Worry: Not on file    Inability: Not on file  . Transportation needs:    Medical: Not on file    Non-medical: Not on file  Tobacco Use  . Smoking status: Current Every Day Smoker  . Smokeless tobacco: Never Used  Substance and Sexual Activity  . Alcohol use: Yes  . Drug use: No  . Sexual activity: Not on file  Lifestyle  . Physical activity:    Days per week: Not on file    Minutes per session: Not on file  . Stress: Not on file  Relationships  . Social connections:    Talks on phone: Not on file    Gets together: Not on file    Attends religious service: Not on file    Active member of club or organization: Not on file    Attends meetings of clubs or organizations: Not on file    Relationship status: Not on file  . Intimate partner  violence:    Fear of current or ex partner: Not on file    Emotionally abused: Not on file    Physically abused: Not on file    Forced sexual activity: Not on file  Other Topics Concern  . Not on file  Social History Narrative  . Not on file     Constitutional: Denies fever, malaise, fatigue, headache or abrupt weight changes.  HEENT: Denies eye pain, eye redness, ear pain, ringing in the ears, wax buildup, runny nose, nasal congestion, bloody nose, or sore throat. Respiratory: Denies difficulty breathing, shortness of breath, cough or sputum production.   Cardiovascular: Pt reports peripheral edema. Denies chest pain, chest tightness, palpitations or swelling in the hands.  Gastrointestinal: Pt reports  abdominal pain, pressure, bloating, intermittent constipation. Denies diarrhea or blood in the stool.  GU: Denies urgency, frequency, pain with urination, burning sensation, blood in urine, odor or discharge. Musculoskeletal: Pt reports bilateral lower leg pain. Denies decrease in range of motion, difficulty with gait, or joint pain and swelling.  Skin: Denies redness, rashes, lesions or ulcercations.  Neurological: Pt reports numbness and tingling in lower extremities. Denies dizziness, difficulty with memory, difficulty with speech or problems with balance and coordination.    No other specific complaints in a complete review of systems (except as listed in HPI above).  Objective:   Physical Exam  BP 138/88   Pulse 81   Temp 98.4 F (36.9 C) (Oral)   Wt 222 lb (100.7 kg)   SpO2 98%   BMI 38.71 kg/m  Wt Readings from Last 3 Encounters:  04/06/19 222 lb (100.7 kg)  01/12/19 214 lb (97.1 kg)  10/04/18 210 lb (95.3 kg)    General: Appears her stated age, obese, in NAD. Neck:  Neck supple, trachea midline. No masses, lumps or thyromegaly present.  Cardiovascular: Normal rate and rhythm. S1,S2 noted.  No murmur, rubs or gallops noted. 1+  Pitting  BLE edema. No carotid bruits noted. Pulmonary/Chest: Normal effort and positive vesicular breath sounds. No respiratory distress. No wheezes, rales or ronchi noted.  Abdomen: Soft and tender in the RLQ. Normal bowel sounds. No distention or masses noted. Liver, spleen and kidneys non palpable. Musculoskeletal: No pain with palpation of the calves. Gait slow and steady. Neurological: Alert and oriented. Sensation intact to BLE.   BMET    Component Value Date/Time   NA 137 09/08/2018 1529   NA 137 07/17/2014 1237   K 3.4 (L) 09/08/2018 1529   K 3.6 07/17/2014 1237   CL 99 09/08/2018 1529   CL 105 07/17/2014 1237   CO2 27 09/08/2018 1529   CO2 28 07/17/2014 1237   GLUCOSE 93 09/08/2018 1529   GLUCOSE 103 (H) 07/17/2014 1237   BUN 12  09/08/2018 1529   BUN 8 07/17/2014 1237   CREATININE 0.64 09/08/2018 1529   CREATININE 0.72 07/17/2014 1237   CALCIUM 9.9 09/08/2018 1529   CALCIUM 8.5 07/17/2014 1237   GFRNONAA >60 06/17/2016 1046   GFRNONAA >60 07/17/2014 1237   GFRAA >60 06/17/2016 1046   GFRAA >60 07/17/2014 1237    Lipid Panel     Component Value Date/Time   CHOL 223 (H) 12/23/2017 1230   TRIG 262.0 (H) 12/23/2017 1230   HDL 28.60 (L) 12/23/2017 1230   CHOLHDL 8 12/23/2017 1230   VLDL 52.4 (H) 12/23/2017 1230    CBC    Component Value Date/Time   WBC 6.7 12/23/2017 1230   RBC 4.82 12/23/2017 1230  HGB 13.9 12/23/2017 1230   HGB 13.5 07/17/2014 1237   HCT 41.9 12/23/2017 1230   HCT 40.2 07/17/2014 1237   PLT 213.0 12/23/2017 1230   PLT 204 07/17/2014 1237   MCV 87.1 12/23/2017 1230   MCV 89 07/17/2014 1237   MCH 29.2 06/17/2016 1046   MCHC 33.1 12/23/2017 1230   RDW 13.9 12/23/2017 1230   RDW 13.3 07/17/2014 1237    Hgb A1C Lab Results  Component Value Date   HGBA1C 6.2 12/23/2017            Assessment & Plan:   HTN, Peripheral Edema:  D/c Losartan HCT RX for Losartan 100 mg daily RX for Lasix 20 mg daily Reinforced DASH diet and exercise for weight loss CMET today, will repeat in 2 weeks  Abdominal Bloating, GERD, Intermittent Constipation:  Discussed adequate water intake OK to take a stool softener as needed Will obtain CMET, H Pylori, Amylase and Lipase today Consider Omeprazole OTC  Pelvic Pain:  Will obtain pelvic/transvaginal ultrasound for further eval  RLS:  Start Mg supplement OTC If no improvement, consider Gabapentin  Will follow up after labs, return precautions discussed Nicki Reaper, NP

## 2019-04-06 NOTE — Patient Instructions (Signed)

## 2019-04-07 ENCOUNTER — Encounter: Payer: Self-pay | Admitting: Internal Medicine

## 2019-04-08 LAB — COMPREHENSIVE METABOLIC PANEL
ALT: 16 IU/L (ref 0–32)
AST: 15 IU/L (ref 0–40)
Albumin/Globulin Ratio: 1.4 (ref 1.2–2.2)
Albumin: 4.2 g/dL (ref 3.8–4.8)
Alkaline Phosphatase: 63 IU/L (ref 39–117)
BUN/Creatinine Ratio: 19 (ref 9–23)
BUN: 13 mg/dL (ref 6–24)
Bilirubin Total: <0.2 mg/dL (ref 0.0–1.2)
CO2: 28 mmol/L (ref 20–29)
Calcium: 9.6 mg/dL (ref 8.7–10.2)
Chloride: 97 mmol/L (ref 96–106)
Creatinine, Ser: 0.68 mg/dL (ref 0.57–1.00)
GFR calc Af Amer: 125 mL/min/{1.73_m2} (ref >59)
GFR calc non Af Amer: 108 mL/min/{1.73_m2} (ref >59)
Globulin, Total: 3 g/dL (ref 1.5–4.5)
Glucose: 86 mg/dL (ref 65–99)
Potassium: 3.8 mmol/L (ref 3.5–5.2)
Sodium: 142 mmol/L (ref 134–144)
Total Protein: 7.2 g/dL (ref 6.0–8.5)

## 2019-04-08 LAB — MAGNESIUM: Magnesium: 1.7 mg/dL (ref 1.6–2.3)

## 2019-04-08 LAB — H PYLORI, IGM, IGG, IGA AB
H pylori, IgM Abs: <9 units (ref 0.0–8.9)
H. pylori, IgA Abs: <9 units (ref 0.0–8.9)
H. pylori, IgG AbS: 0.26 Index Value (ref 0.00–0.79)

## 2019-04-08 LAB — AMYLASE: Amylase: 42 U/L (ref 31–110)

## 2019-04-08 LAB — LIPASE: Lipase: 39 U/L (ref 14–72)

## 2019-04-13 ENCOUNTER — Other Ambulatory Visit: Payer: Self-pay

## 2019-04-13 ENCOUNTER — Ambulatory Visit
Admission: RE | Admit: 2019-04-13 | Discharge: 2019-04-13 | Disposition: A | Discharge status: Home / Self Care | Payer: 59 | Source: Ambulatory Visit | Attending: Internal Medicine | Admitting: Internal Medicine

## 2019-04-13 ENCOUNTER — Encounter: Payer: 59 | Admitting: Internal Medicine

## 2019-04-13 DIAGNOSIS — N838 Other noninflammatory disorders of ovary, fallopian tube and broad ligament: Secondary | ICD-10-CM | POA: Diagnosis not present

## 2019-04-13 DIAGNOSIS — R102 Pelvic and perineal pain: Secondary | ICD-10-CM | POA: Diagnosis not present

## 2019-04-18 ENCOUNTER — Other Ambulatory Visit: Payer: Self-pay | Admitting: Internal Medicine

## 2019-04-18 MED ORDER — GABAPENTIN 100 MG PO CAPS: 100.0000 mg | ORAL_CAPSULE | Freq: Three times a day (TID) | ORAL | 2 refills | Status: DC

## 2019-05-05 ENCOUNTER — Encounter: Payer: Self-pay | Admitting: Internal Medicine

## 2019-07-19 ENCOUNTER — Telehealth: Payer: 59 | Admitting: Nurse Practitioner

## 2019-07-19 DIAGNOSIS — H10021 Other mucopurulent conjunctivitis, right eye: Secondary | ICD-10-CM

## 2019-07-19 MED ORDER — POLYMYXIN B-TRIMETHOPRIM 10000-0.1 UNIT/ML-% OP SOLN: 2.0000 [drp] | OPHTHALMIC | 0 refills | Status: DC

## 2019-07-19 NOTE — Progress Notes (Signed)

## 2019-07-22 ENCOUNTER — Telehealth: Payer: 59 | Admitting: Physician Assistant

## 2019-07-22 DIAGNOSIS — N898 Other specified noninflammatory disorders of vagina: Secondary | ICD-10-CM

## 2019-07-22 DIAGNOSIS — R102 Pelvic and perineal pain: Secondary | ICD-10-CM

## 2019-07-22 NOTE — Progress Notes (Signed)
Based on what you shared with me, I feel your condition warrants further evaluation and I recommend that you be seen for a face to face office visit.  Giving vaginal pain and genital lesions along with a low-grade fever which is not common with yeast infection, you need to be seen in person for a vaginal exam and testing so the appropriate treatment can be given.  NOTE: If you entered your credit card information for this eVisit, you will not be charged. You may see a "hold" on your card for the $35 but that hold will drop off and you will not have a charge processed.  If you are having a true medical emergency please call 911.     For an urgent face to face visit, [place] has five urgent care centers for your convenience:    [email or web address] to reserve your spot online an avoid wait times  [facility] (New Address!) [address], [place], [place] [address] *Just off [address], across the road from [place] hours of operation: Monday-Friday, 12 PM to 6 PM  Closed Saturday & Sunday   The following sites will take your insurance:  . [facility]    [phone]                  Get Driving Directions  [address], [place] [address] . 10 am to 8 pm Monday-Friday . 12 pm to 8 pm Saturday-Sunday   . [facility] at [place]                  Get Driving Directions  [address], [place], [place] [address] . 8 am to 8 pm Monday-Friday . 9 am to 6 pm Saturday . 11 am to 6 pm Sunday   . [facility] at [place]                  Get Driving Directions   [address], [place] [address] . 8 am to 8 pm Monday-Friday . 8 am to 4 pm Saturday-Sunday    . [facility] at                     Get Driving Directions  [phone]  [address] [place]  [place], [place] [address]  . Monday-Friday, 12 PM to 6 PM    Your e-visit answers were reviewed by a board certified advanced clinical practitioner to complete your personal care plan.  Thank you for using e-Visits.

## 2019-07-28 ENCOUNTER — Telehealth: Payer: 59 | Admitting: Family

## 2019-07-28 DIAGNOSIS — L551 Sunburn of second degree: Secondary | ICD-10-CM | POA: Diagnosis not present

## 2019-07-28 MED ORDER — TRIAMCINOLONE ACETONIDE 0.025 % EX OINT: 1.0000 "application " | TOPICAL_OINTMENT | Freq: Two times a day (BID) | CUTANEOUS | 0 refills | Status: DC

## 2019-07-28 NOTE — Progress Notes (Signed)
We are sorry that you are not feeling well.  Here is how we plan to help!  Based on what you have shared with me, I'd like to share with you a treatment plan for sunburn.   Most sunburn is a first degree burn that turns the skin pink or red.  It can be painful to touch.  If you stayed in the sun for a prolonged period this might have progressed to a second degree burn with blistering!  Usually the pain and swelling starts after about 4 hours, peaks at 24 hours and begins to improve after 48 hours or about 2 days.  REMEMBER prolonged exposure to the sun increases your risk of skin cancer so use sunscreen before you go outside!  We will give you more information about sunscreen use later in your care plan.  Your sunburn can be managed by self-care at home.  Please use the following care guide to manage your sunburn.  If you symptoms worsen, you have other questions or concerns, or you develop any of the warnings signs listed in your care plan you will need to seek a face to face visit with a provider without waiting!  If you believe the losartan is causing this sensitivity, I recommend you discussing with your PCP about changing to another medication. There is a 2% chance of photosensitivity with losartan. I have sent in kenalog cream 0.025% that you can apply twice a day for the next 3 days. If you have any signs of infection you need to follow up with your PCP.   Approximately 5 minutes was spent documenting and reviewing patient's chart.   Home Care Advice for Treating Mild Sunburn:  1. Take Ibuprofen (Advil, Motrin) for pain relief as soon as possible.  The adult dosage is up to 600 mg every 6 hours.  Starting within 6 hours of sun exposure may greatly reduce your discomfort.  If you cannot take Ibuprofen you may use Acetaminophen instead.  Do not take Ibuprofen if you have stomach problems, kidney disease or are pregnant.  Do not take Ibuprofen if you have been told by your doctor or  pharmacist to avoid this class of drugs.  Do not take Acetaminophen if you have liver disease.  Read the package warnings on any medication that you take!  2.  Use a steroid cream on the affected skin.  If you apply an over the counter steroid       cream as soon as possible and repeat it three times a day it may reduce the pain and      and swelling.  Until you get the steroid cream you may start with a moistening cream      cream or aloe gel.  3. For second or third degree sunburn with painful blistering, you can use an over the         counter product Burn Jel Plus Pain Relieving Gel. Apply in a thick even layer over the     affected area not more than 3 to 4 times daily If you need to cover the area to protect      it from friction of clothing, you can use Moist Burn Pads such as Hydrogel Burn Pads       which are available over the counter.  4.  Apply cool compresses to the burned areas several times a day.  5.  Avoid soap on the sunburned areas.  6.  Drink plenty of water.  It is  easy to get dehydrated from prolong time in the sun      Outdoors.  7.  For any broken blisters:  Trim off the dead skin with fine scissors.  It is wise to clean the scissor with alcohol before use.  Apply antibiotic ointments to the blister.  Apply twice a day for three days.  There are triple antibiotic ointments with topical pain relievers available at stores.  Caution:leave intact blisters alone.  They are protecting the skin and will allow it to heal.  8.  Taking Vitamin C orally may reduce sun damage to your skin.  Follow the      Instructions on the bottle.  The recommended adult dosage is 2 grams.  What to Expect:  1. Pain usually stops after 2 or 3 days. 2. Skin flaking and peeling usually occurs for 3-7 days after a sunburn.  Call your provider if:  1. You feel very weak or have difficulty standing. 2. Blister develops on your face. 3. You become sensitive to light because of eye  pain. 4. Your skin looks infected (red streaks, puss or worsening tenderness after 48 hours. 5. You feel you should be seen.  Preventing Sunburns:  1. Apply 20-30 SPF sunscreen to your skin before going into the sun. 2. Reapply every 2-4 hours or after sweating or swimming. 3. Sunscreens protect from sunburns but do not completely prevent skin damage.  Nancy Fetter exposure still increases your risk of premature aging and skin cancers.  Your e-visit answers were reviewed by a board certified advanced clinical practitioner to complete your personal care plan.  Depending on the condition, your plan could have included both over the counter or prescription medications.  If there is a problem please reply  once you have received a response from your provider.  Your safety is important to [place].  If you have drug allergies check your prescription carefully.    You can use MyChart to ask questions about today's visit, request a non-urgent call back, or ask for a work or school excuse for 24 hours related to this e-Visit. If it has been greater than 24 hours you will need to follow up with your provider, or enter a new e-Visit to address those concerns.  You will get an e-mail in the next two days asking about your experience.  I hope that your e-visit has been valuable and will speed your recovery. Thank you for using e-visits.

## 2019-08-29 ENCOUNTER — Telehealth: Payer: Self-pay | Admitting: Internal Medicine

## 2019-08-29 NOTE — Telephone Encounter (Signed)
Pt is scheduled to see Jasmine Miller on 9/3 to discuss anxiety and blood pressure. She is wondering if she can get a work note to keep her out until then. Please advise.

## 2019-08-29 NOTE — Telephone Encounter (Signed)
I don't back date notes. I can give her a note at her appt, but it won't be effective until 9/3

## 2019-08-30 NOTE — Telephone Encounter (Signed)
Pt is aware.  

## 2019-08-31 ENCOUNTER — Other Ambulatory Visit: Payer: Self-pay

## 2019-08-31 ENCOUNTER — Encounter: Payer: Self-pay | Admitting: Internal Medicine

## 2019-08-31 ENCOUNTER — Ambulatory Visit (INDEPENDENT_AMBULATORY_CARE_PROVIDER_SITE_OTHER): Payer: 59 | Admitting: Internal Medicine

## 2019-08-31 DIAGNOSIS — I10 Essential (primary) hypertension: Secondary | ICD-10-CM | POA: Diagnosis not present

## 2019-08-31 DIAGNOSIS — Z79899 Other long term (current) drug therapy: Secondary | ICD-10-CM

## 2019-08-31 DIAGNOSIS — F411 Generalized anxiety disorder: Secondary | ICD-10-CM

## 2019-08-31 MED ORDER — AMLODIPINE BESYLATE 5 MG PO TABS: 5.0000 mg | ORAL_TABLET | Freq: Every day | ORAL | 0 refills | Status: DC

## 2019-08-31 MED ORDER — FLUOXETINE HCL 10 MG PO CAPS: 10.0000 mg | ORAL_CAPSULE | Freq: Every day | ORAL | 2 refills | Status: DC

## 2019-08-31 MED ORDER — CLONAZEPAM 0.5 MG PO TABS: 0.5000 mg | ORAL_TABLET | Freq: Every day | ORAL | 0 refills | Status: DC | PRN

## 2019-08-31 NOTE — Assessment & Plan Note (Signed)
Continue Losartan Add Amlodipine 5 mg PO daily Reinforced DASH diet and exercise for weight loss  RTC in 2 weeks for follow up HTN

## 2019-08-31 NOTE — Assessment & Plan Note (Signed)
Deteriorated Support offered today She declines referral for therapy Start Fluoxetine 10 mg PO QHS- discussed common side effects RX for Clonazepam 0.5 mg daily prn #30, 0 refills CSA and UDS today

## 2019-08-31 NOTE — Patient Instructions (Signed)

## 2019-08-31 NOTE — Progress Notes (Signed)
Subjective:    Patient ID: Jasmine Miller, female    DOB: 07/04/1976, 43 y.o.   MRN: [id number]  HPI  Pt presents to the clinic today to follow up chronic conditions.  HTN: Her BP today is 148/96. She is taking Losartan as prescribed but stopped Furosemide due to urinary frequency. She reports she went to the ENT and her BP was 202/99. She has some chest tightness intermittently which she relates to anxiety. She denies chest pain or shortness of breath. ECG from 05/2016 reviewed.  GAD: Intermittent but worse in the last 2-3 months. She reports she is becoming easily annoyed and irritable. She is under a lot of stress at work. She reports things that used to not bother her are now starting to bother her. She feels overwhelmed, is having crying spells and panic attacks. She is having trouble sleeping at night.  She has taken Clonazepam in the past with some relief. She is not currently seeing a therapist.  Review of Systems      Past Medical History:  Diagnosis Date  . Hypertension     Current Outpatient Medications  Medication Sig Dispense Refill  . furosemide (LASIX) 20 MG tablet Take 1 tablet (20 mg total) by mouth daily. 30 tablet 3  . gabapentin (NEURONTIN) 100 MG capsule Take 1 capsule (100 mg total) by mouth 3 (three) times daily. 30 capsule 2  . losartan (COZAAR) 100 MG tablet Take 1 tablet (100 mg total) by mouth daily. 90 tablet 1  . triamcinolone (KENALOG) 0.025 % ointment Apply 1 application topically 2 (two) times daily. 30 g 0  . trimethoprim-polymyxin b (POLYTRIM) ophthalmic solution Place 2 drops into both eyes every 4 (four) hours. 10 mL 0  . VENTOLIN HFA 108 (90 Base) MCG/ACT inhaler INHALE 1-2 PUFFS INTO THE LUNGS EVERY 6 (SIX) HOURS AS NEEDED (FOR COUGH) 18 Inhaler 0   No current facility-administered medications for this visit.     Allergies  Allergen Reactions  . Chantix [Varenicline Tartrate] Swelling    Lip swelling and rash  . Penicillins Anaphylaxis    Family History  Problem Relation Age of Onset  . Breast cancer Maternal Aunt   . Breast cancer Maternal Grandmother   . Breast cancer Maternal Aunt     Social History   Socioeconomic History  . Marital status: Single    Spouse name: Not on file  . Number of children: Not on file  . Years of education: Not on file  . Highest education level: Not on file  Occupational History  . Not on file  Social Needs  . Financial resource strain: Not on file  . Food insecurity    Worry: Not on file    Inability: Not on file  . Transportation needs    Medical: Not on file    Non-medical: Not on file  Tobacco Use  . Smoking status: Current Every Day Smoker    Types: E-cigarettes  . Smokeless tobacco: Never Used  Substance and Sexual Activity  . Alcohol use: Yes  . Drug use: No  . Sexual activity: Not on file  Lifestyle  . Physical activity    Days per week: Not on file    Minutes per session: Not on file  . Stress: Not on file  Relationships  . Social Musicianconnections    Talks on phone: Not on file    Gets together: Not on file    Attends religious service: Not on file    Active member  of club or organization: Not on file    Attends meetings of clubs or organizations: Not on file    Relationship status: Not on file  . Intimate partner violence    Fear of current or ex partner: Not on file    Emotionally abused: Not on file    Physically abused: Not on file    Forced sexual activity: Not on file  Other Topics Concern  . Not on file  Social History Narrative  . Not on file     Constitutional: Denies fever, malaise, fatigue, headache or abrupt weight changes.  Respiratory: Denies difficulty breathing, shortness of breath, cough or sputum production.   Cardiovascular: Pt reports chest tightness. Denies chest pain, palpitations or swelling in the hands or feet.  Neurological: Pt reports insomnia. Denies dizziness, difficulty with memory, difficulty with speech or problems with  balance and coordination.  Psych: Pt reports anxiety and depression. Denies SI/HI.  No other specific complaints in a complete review of systems (except as listed in HPI above).  Objective:   Physical Exam  BP (!) 148/96   Pulse 68   Temp 98.7 F (37.1 C) (Temporal)   Wt 207 lb (93.9 kg)   SpO2 98%   BMI 36.09 kg/m  Wt Readings from Last 3 Encounters:  08/31/19 207 lb (93.9 kg)  04/06/19 222 lb (100.7 kg)  01/12/19 214 lb (97.1 kg)    General: Appears her stated age, obese, in NAD. SCardiovascular: Normal rate and rhythm. S1,S2 noted.  No murmur, rubs or gallops noted. No JVD or BLE edema. Pulmonary/Chest: Normal effort and positive vesicular breath sounds. No respiratory distress. No wheezes, rales or ronchi noted.  Neurological: Alert and oriented.   Psychiatric: Mood and affect mildly flat. Behavior is normal. Judgment and thought content normal.     BMET    Component Value Date/Time   NA 142 04/06/2019 1554   NA 137 07/17/2014 1237   K 3.8 04/06/2019 1554   K 3.6 07/17/2014 1237   CL 97 04/06/2019 1554   CL 105 07/17/2014 1237   CO2 28 04/06/2019 1554   CO2 28 07/17/2014 1237   GLUCOSE 86 04/06/2019 1554   GLUCOSE 93 09/08/2018 1529   GLUCOSE 103 (H) 07/17/2014 1237   BUN 13 04/06/2019 1554   BUN 8 07/17/2014 1237   CREATININE 0.68 04/06/2019 1554   CREATININE 0.72 07/17/2014 1237   CALCIUM 9.6 04/06/2019 1554   CALCIUM 8.5 07/17/2014 1237   GFRNONAA 108 04/06/2019 1554   GFRNONAA >60 07/17/2014 1237   GFRAA 125 04/06/2019 1554   GFRAA >60 07/17/2014 1237    Lipid Panel     Component Value Date/Time   CHOL 223 (H) 12/23/2017 1230   TRIG 262.0 (H) 12/23/2017 1230   HDL 28.60 (L) 12/23/2017 1230   CHOLHDL 8 12/23/2017 1230   VLDL 52.4 (H) 12/23/2017 1230    CBC    Component Value Date/Time   WBC 6.7 12/23/2017 1230   RBC 4.82 12/23/2017 1230   HGB 13.9 12/23/2017 1230   HGB 13.5 07/17/2014 1237   HCT 41.9 12/23/2017 1230   HCT 40.2  07/17/2014 1237   PLT 213.0 12/23/2017 1230   PLT 204 07/17/2014 1237   MCV 87.1 12/23/2017 1230   MCV 89 07/17/2014 1237   MCH 29.2 06/17/2016 1046   MCHC 33.1 12/23/2017 1230   RDW 13.9 12/23/2017 1230   RDW 13.3 07/17/2014 1237    Hgb A1C Lab Results  Component Value Date  HGBA1C 6.2 12/23/2017            Assessment & Plan:

## 2019-09-01 ENCOUNTER — Encounter: Payer: Self-pay | Admitting: Internal Medicine

## 2019-09-01 LAB — PAIN MGMT, PROFILE 8 W/CONF, U
6 Acetylmorphine: NEGATIVE ng/mL
Alcohol Metabolites: NEGATIVE ng/mL (ref <500)
Amphetamines: NEGATIVE ng/mL
Benzodiazepines: NEGATIVE ng/mL
Buprenorphine, Urine: NEGATIVE ng/mL
Cocaine Metabolite: NEGATIVE ng/mL
Creatinine: 50.2 mg/dL
MDMA: NEGATIVE ng/mL
Marijuana Metabolite: NEGATIVE ng/mL
Opiates: NEGATIVE ng/mL
Oxidant: NEGATIVE ug/mL
Oxycodone: NEGATIVE ng/mL
pH: 7.2 (ref 4.5–9.0)

## 2019-09-14 ENCOUNTER — Ambulatory Visit: Payer: 59 | Admitting: Internal Medicine

## 2019-09-21 ENCOUNTER — Ambulatory Visit (INDEPENDENT_AMBULATORY_CARE_PROVIDER_SITE_OTHER): Payer: 59 | Admitting: Internal Medicine

## 2019-09-21 ENCOUNTER — Other Ambulatory Visit: Payer: Self-pay

## 2019-09-21 ENCOUNTER — Encounter: Payer: Self-pay | Admitting: Internal Medicine

## 2019-09-21 VITALS — BP 132/86 | HR 94 | Temp 98.5°F | Wt 205.0 lb

## 2019-09-21 DIAGNOSIS — L409 Psoriasis, unspecified: Secondary | ICD-10-CM

## 2019-09-21 DIAGNOSIS — F411 Generalized anxiety disorder: Secondary | ICD-10-CM | POA: Diagnosis not present

## 2019-09-21 DIAGNOSIS — I10 Essential (primary) hypertension: Secondary | ICD-10-CM

## 2019-09-21 MED ORDER — CLOBETASOL PROPIONATE 0.05 % EX CREA: 1.0000 "application " | TOPICAL_CREAM | Freq: Two times a day (BID) | CUTANEOUS | 2 refills | Status: DC

## 2019-09-21 MED ORDER — AMLODIPINE BESYLATE 5 MG PO TABS: 5.0000 mg | ORAL_TABLET | Freq: Every day | ORAL | 3 refills | Status: DC

## 2019-09-21 NOTE — Assessment & Plan Note (Addendum)
Anxious now- had stressful event just before appt Continue Losartan, Furosemide and Amlodipine Reinforced DASH diet and exercise for weight loss

## 2019-09-21 NOTE — Patient Instructions (Signed)
Psoriasis Psoriasis is a long-term (chronic) skin condition. It occurs because your body's defense system (immune system) causes skin cells to form too quickly. This causes raised, red patches (plaques) on your skin that look silvery. The patches may be on all areas of your body. They can be any size or shape. Psoriasis can come and go. It can range from mild to very bad. It cannot be passed from one person to another (is not contagious). There is no cure for this condition, but it can be helped with treatment. What are the causes? The cause of psoriasis is not known. Some things can make it worse. These are:  Skin damage, such as cuts, scrapes, sunburn, and dryness.  Not getting enough sunlight.  Some medicines.  Alcohol.  Tobacco.  Stress.  Infections. What increases the risk?  Having a family member with psoriasis.  Being very overweight (obese).  Being 20-40 years old.  Taking certain medicines. What are the signs or symptoms? There are different types of psoriasis. The types are:  Plaque. This is the most common. Symptoms include red, raised patches with a silvery coating. These may be itchy. Your nails may be crumbly or fall off.  Guttate. Symptoms include small red spots on your stomach area, arms, and legs. These may happen after you have been sick, such as with strep throat.  Inverse. Symptoms include patches in your armpits, under your breasts, private areas, or on your butt.  Pustular. Symptoms include pus-filled bumps on the palms of your hands or the soles of your feet. You also may feel very tired, weak, have a fever, and not be hungry.  Erythrodermic. Symptoms include bright red skin that looks burned. You may have a fast heartbeat and a body temperature that is too high or too low. You may be itchy or in pain.  Sebopsoriasis. Symptoms include red patches on your scalp, forehead, and face that are greasy.  Psoriatic arthritis. Symptoms include swollen,  painful joints along with scaly skin patches. How is this treated? There is no cure for this condition, but treatment can:  Help your skin heal.  Lessen itching and irritation and swelling (inflammation).  Slow the growth of new skin cells.  Help your body's defense system respond better to your skin. Treatment may include:  Creams or ointments.  Light therapy. This may include natural sunlight or light therapy in a doctor's office.  Medicines. These can help your body better manage skin cells. They may be used with light therapy or ointments. Medicines may include pills or injections. You may also get antibiotic medicines if you have an infection. Follow these instructions at home: Skin Care  Apply lotion to your skin as needed. Only use those that your doctor has said are okay.  Apply cool, wet cloths (cold compresses) to the affected areas.  Do not use a hot tub or take hot showers. Use slightly warm, not hot, water when taking showers and baths.  Do not scratch your skin. Lifestyle   Do not use any products that contain nicotine or tobacco, such as cigarettes, e-cigarettes, and chewing tobacco. If you need help quitting, ask your doctor.  Lower your stress.  Keep a healthy weight.  Go out in the sun as told by your doctor. Do not get sunburned.  Join a support group. Medicines  Take or use over-the-counter and prescription medicines only as told by your doctor.  If you were prescribed an antibiotic medicine, take it as told by your doctor.   Do not stop using the antibiotic even if you start to feel better. Alcohol use If you drink alcohol:  Limit how much you use: ? 0-1 drink a day for women. ? 0-2 drinks a day for men.  Be aware of how much alcohol is in your drink. In the [place] one drink equals one 12 oz bottle of beer (355 mL), one 5 oz glass of wine (148 mL), or one 1 oz glass of hard liquor (44 mL). General instructions  Keep a journal to track the  things that cause symptoms (triggers). Try to avoid these things.  See a counselor if you feel the support would help.  Keep all follow-up visits as told by your doctor. This is important. Contact a doctor if:  You have a fever.  Your pain gets worse.  You have more redness or warmth in the affected areas.  You have new or worse pain or stiffness in your joints.  Your nails start to break easily or pull away from the nail bed.  You feel very sad (depressed). Summary  Psoriasis is a long-term (chronic) skin condition.  There is no cure for this condition, but treatment can help manage it.  Keep a journal to track the things that cause symptoms.  Take or use over-the-counter and prescription medicines only as told by your doctor.  Keep all follow-up visits as told by your doctor. This is important. This information is not intended to replace advice given to you by your health care provider. Make sure you discuss any questions you have with your health care provider. Document Released: 01/21/2005 Document Revised: 10/18/2018 Document Reviewed: 10/18/2018 Elsevier Patient Education  2020 [employer]  

## 2019-09-21 NOTE — Assessment & Plan Note (Signed)
RX for Clobetasol cream BID  Referral to dermatology placed

## 2019-09-21 NOTE — Assessment & Plan Note (Signed)
Improved on Fluoxetine and Clonazepam Support offered today Will monitor

## 2019-09-21 NOTE — Progress Notes (Signed)
Subjective:    Patient ID: Jasmine Miller, female    DOB: [birth year], 43 y.o.   MRN: [id number]  HPI  Pt presents to the clinic today for 3 week follow up.  HTN: Her BP today is 132/86. She is taking Amlodipine, Furosemide and Losartan as prescribed. She denies adverse side effects. ECG from 05/2016 reviewed.  Anxiety: Recently started on Fluoxetine and Clonazepam. She reports improvement in her anxiety. She is not currently seeing a therapist. She denies depression, SI/HI.  She also reports psoriasis of upper and lower extremities. She is not using any anything on this.  Review of Systems  Past Medical History:  Diagnosis Date  . Hypertension     Current Outpatient Medications  Medication Sig Dispense Refill  . amLODipine (NORVASC) 5 MG tablet Take 1 tablet (5 mg total) by mouth daily. 30 tablet 0  . clonazePAM (KLONOPIN) 0.5 MG tablet Take 1 tablet (0.5 mg total) by mouth daily as needed for anxiety. 20 tablet 0  . FLUoxetine (PROZAC) 10 MG capsule Take 1 capsule (10 mg total) by mouth daily. 30 capsule 2  . furosemide (LASIX) 20 MG tablet Take 1 tablet (20 mg total) by mouth daily. (Patient not taking: Reported on 08/31/2019) 30 tablet 3  . gabapentin (NEURONTIN) 100 MG capsule Take 1 capsule (100 mg total) by mouth 3 (three) times daily. (Patient taking differently: Take 100 mg by mouth 3 (three) times daily as needed. ) 30 capsule 2  . losartan (COZAAR) 100 MG tablet Take 1 tablet (100 mg total) by mouth daily. 90 tablet 1  . triamcinolone (KENALOG) 0.025 % ointment Apply 1 application topically 2 (two) times daily. 30 g 0  . trimethoprim-polymyxin b (POLYTRIM) ophthalmic solution Place 2 drops into both eyes every 4 (four) hours. 10 mL 0  . VENTOLIN HFA 108 (90 Base) MCG/ACT inhaler INHALE 1-2 PUFFS INTO THE LUNGS EVERY 6 (SIX) HOURS AS NEEDED (FOR COUGH) 18 Inhaler 0   No current facility-administered medications for this visit.     Allergies  Allergen Reactions  .  Chantix [Varenicline Tartrate] Swelling    Lip swelling and rash  . Penicillins Anaphylaxis    Family History  Problem Relation Age of Onset  . Breast cancer Maternal Aunt   . Breast cancer Maternal Grandmother   . Breast cancer Maternal Aunt     Social History   Socioeconomic History  . Marital status: Single    Spouse name: Not on file  . Number of children: Not on file  . Years of education: Not on file  . Highest education level: Not on file  Occupational History  . Not on file  Social Needs  . Financial resource strain: Not on file  . Food insecurity    Worry: Not on file    Inability: Not on file  . Transportation needs    Medical: Not on file    Non-medical: Not on file  Tobacco Use  . Smoking status: Current Every Day Smoker    Types: E-cigarettes  . Smokeless tobacco: Never Used  Substance and Sexual Activity  . Alcohol use: Yes  . Drug use: No  . Sexual activity: Not on file  Lifestyle  . Physical activity    Days per week: Not on file    Minutes per session: Not on file  . Stress: Not on file  Relationships  . Social Herbalist on phone: Not on file    Gets together: Not on file  Attends religious service: Not on file    Active member of club or organization: Not on file    Attends meetings of clubs or organizations: Not on file    Relationship status: Not on file  . Intimate partner violence    Fear of current or ex partner: Not on file    Emotionally abused: Not on file    Physically abused: Not on file    Forced sexual activity: Not on file  Other Topics Concern  . Not on file  Social History Narrative  . Not on file     Constitutional: Denies fever, malaise, fatigue, headache or abrupt weight changes.  Skin: Pt reports rash of upper and lower extremities. Respiratory: Denies difficulty breathing, shortness of breath, cough or sputum production.   Cardiovascular: Denies chest pain, chest tightness, palpitations or swelling in  the hands or feet.  Neurological: Denies dizziness, difficulty with memory, difficulty with speech or problems with balance and coordination.  Psych: Pt reports anxiety. Denies depression, SI/HI.  No other specific complaints in a complete review of systems (except as listed in HPI above).     Objective:   Physical Exam  BP 132/86   Pulse 94   Temp 98.5 F (36.9 C) (Temporal)   Wt 205 lb (93 kg)   SpO2 98%   BMI 35.74 kg/m  Wt Readings from Last 3 Encounters:  09/21/19 205 lb (93 kg)  08/31/19 207 lb (93.9 kg)  04/06/19 222 lb (100.7 kg)    General: Appears her stated age, obese, in NAD. Skin: Plaques noted on bilateral forearms and bilateral lower legs. Cardiovascular: Normal rate and rhythm. S1,S2 noted.  No murmur, rubs or gallops noted. No JVD or BLE edema. Pulmonary/Chest: Normal effort and positive vesicular breath sounds. No respiratory distress. No wheezes, rales or ronchi noted.  Neurological: Alert and oriented.  Psychiatric: Mood and affect mildly anxious appearing. Behavior is normal. Judgment and thought content normal.    BMET    Component Value Date/Time   NA 142 04/06/2019 1554   NA 137 07/17/2014 1237   K 3.8 04/06/2019 1554   K 3.6 07/17/2014 1237   CL 97 04/06/2019 1554   CL 105 07/17/2014 1237   CO2 28 04/06/2019 1554   CO2 28 07/17/2014 1237   GLUCOSE 86 04/06/2019 1554   GLUCOSE 93 09/08/2018 1529   GLUCOSE 103 (H) 07/17/2014 1237   BUN 13 04/06/2019 1554   BUN 8 07/17/2014 1237   CREATININE 0.68 04/06/2019 1554   CREATININE 0.72 07/17/2014 1237   CALCIUM 9.6 04/06/2019 1554   CALCIUM 8.5 07/17/2014 1237   GFRNONAA 108 04/06/2019 1554   GFRNONAA >60 07/17/2014 1237   GFRAA 125 04/06/2019 1554   GFRAA >60 07/17/2014 1237    Lipid Panel     Component Value Date/Time   CHOL 223 (H) 12/23/2017 1230   TRIG 262.0 (H) 12/23/2017 1230   HDL 28.60 (L) 12/23/2017 1230   CHOLHDL 8 12/23/2017 1230   VLDL 52.4 (H) 12/23/2017 1230    CBC     Component Value Date/Time   WBC 6.7 12/23/2017 1230   RBC 4.82 12/23/2017 1230   HGB 13.9 12/23/2017 1230   HGB 13.5 07/17/2014 1237   HCT 41.9 12/23/2017 1230   HCT 40.2 07/17/2014 1237   PLT 213.0 12/23/2017 1230   PLT 204 07/17/2014 1237   MCV 87.1 12/23/2017 1230   MCV 89 07/17/2014 1237   MCH 29.2 06/17/2016 1046   MCHC 33.1 12/23/2017 1230  RDW 13.9 12/23/2017 1230   RDW 13.3 07/17/2014 1237    Hgb A1C Lab Results  Component Value Date   HGBA1C 6.2 12/23/2017            Assessment & Plan:

## 2019-09-22 ENCOUNTER — Other Ambulatory Visit: Payer: Self-pay | Admitting: Internal Medicine

## 2019-09-24 ENCOUNTER — Other Ambulatory Visit: Payer: Self-pay | Admitting: Internal Medicine

## 2019-09-26 NOTE — Telephone Encounter (Signed)
Last filled 08/31/2019.Marland KitchenMarland KitchenMarland Kitchen please advise

## 2019-09-27 ENCOUNTER — Encounter: Payer: Self-pay | Admitting: Internal Medicine

## 2019-10-09 ENCOUNTER — Other Ambulatory Visit: Payer: Self-pay | Admitting: Internal Medicine

## 2019-10-09 DIAGNOSIS — R140 Abdominal distension (gaseous): Secondary | ICD-10-CM

## 2019-10-09 DIAGNOSIS — K219 Gastro-esophageal reflux disease without esophagitis: Secondary | ICD-10-CM

## 2019-10-09 DIAGNOSIS — I10 Essential (primary) hypertension: Secondary | ICD-10-CM

## 2019-10-23 ENCOUNTER — Encounter: Payer: Self-pay | Admitting: Internal Medicine

## 2019-10-23 ENCOUNTER — Other Ambulatory Visit: Payer: Self-pay | Admitting: Internal Medicine

## 2019-10-24 MED ORDER — TRAZODONE HCL 50 MG PO TABS: 25.0000 mg | ORAL_TABLET | Freq: Every evening | ORAL | 0 refills | Status: DC | PRN

## 2019-10-24 NOTE — Addendum Note (Signed)
Addended by: Jearld Fenton on: 10/24/2019 04:35 PM   Modules accepted: Orders

## 2019-10-24 NOTE — Telephone Encounter (Signed)
Last filled 09/27/2019... please advise  

## 2019-10-26 MED ORDER — CLONAZEPAM 0.5 MG PO TABS: 0.5000 mg | ORAL_TABLET | Freq: Every day | ORAL | 0 refills | Status: DC | PRN

## 2019-11-15 ENCOUNTER — Other Ambulatory Visit: Payer: Self-pay | Admitting: Internal Medicine

## 2019-11-30 ENCOUNTER — Other Ambulatory Visit: Payer: Self-pay | Admitting: Internal Medicine

## 2019-11-30 NOTE — Telephone Encounter (Signed)
Last filled 10/26/2019... please advise

## 2019-12-16 ENCOUNTER — Other Ambulatory Visit: Payer: Self-pay | Admitting: Internal Medicine

## 2019-12-18 ENCOUNTER — Other Ambulatory Visit: Payer: Self-pay | Admitting: Internal Medicine

## 2019-12-18 ENCOUNTER — Encounter: Payer: Self-pay | Admitting: Internal Medicine

## 2019-12-18 MED ORDER — FLUOXETINE HCL 20 MG PO TABS: 20.0000 mg | ORAL_TABLET | Freq: Every day | ORAL | 1 refills | Status: DC

## 2020-01-12 ENCOUNTER — Ambulatory Visit (INDEPENDENT_AMBULATORY_CARE_PROVIDER_SITE_OTHER): Payer: 59 | Admitting: Internal Medicine

## 2020-01-12 ENCOUNTER — Encounter: Payer: Self-pay | Admitting: Internal Medicine

## 2020-01-12 ENCOUNTER — Other Ambulatory Visit: Payer: Self-pay

## 2020-01-12 VITALS — BP 132/76 | HR 74 | Temp 98.3°F | Wt 218.0 lb

## 2020-01-12 DIAGNOSIS — R350 Frequency of micturition: Secondary | ICD-10-CM

## 2020-01-12 DIAGNOSIS — M545 Low back pain, unspecified: Secondary | ICD-10-CM

## 2020-01-12 DIAGNOSIS — R3989 Other symptoms and signs involving the genitourinary system: Secondary | ICD-10-CM | POA: Diagnosis not present

## 2020-01-12 LAB — POC URINALSYSI DIPSTICK (AUTOMATED)
Bilirubin, UA: NEGATIVE
Blood, UA: NEGATIVE
Glucose, UA: NEGATIVE
Ketones, UA: NEGATIVE
Leukocytes, UA: NEGATIVE
Nitrite, UA: NEGATIVE
Protein, UA: NEGATIVE
Spec Grav, UA: 1.015 (ref 1.010–1.025)
Urobilinogen, UA: 0.2 E.U./dL
pH, UA: 7 (ref 5.0–8.0)

## 2020-01-12 MED ORDER — NITROFURANTOIN MONOHYD MACRO 100 MG PO CAPS: 100.0000 mg | ORAL_CAPSULE | Freq: Two times a day (BID) | ORAL | 0 refills | Status: DC

## 2020-01-12 NOTE — Progress Notes (Signed)
HPI  Pt presents to the clinic today with c/o urinary urgency and frequency, bladder pressure and bilateral low back pain. This started 2 days ago. Her back pain is bilateral and radiates to her lower abdomen which has full feeling. She has episodes of urinary urgency leading to incontinence which have been occurring before other symptoms began. She admits fatigue and chills with intermittent dizziness causing her to miss work 2 days this week. Shed denies vaginal discharge, odor, irritation or abnormal bleeding. She has not take anything OTC for this.    Review of Systems  Past Medical History:  Diagnosis Date  . Hypertension     Family History  Problem Relation Age of Onset  . Breast cancer Maternal Aunt   . Breast cancer Maternal Grandmother   . Breast cancer Maternal Aunt     Social History   Socioeconomic History  . Marital status: Single    Spouse name: Not on file  . Number of children: Not on file  . Years of education: Not on file  . Highest education level: Not on file  Occupational History  . Not on file  Tobacco Use  . Smoking status: Current Every Day Smoker    Types: E-cigarettes  . Smokeless tobacco: Never Used  Substance and Sexual Activity  . Alcohol use: Yes  . Drug use: No  . Sexual activity: Not on file  Other Topics Concern  . Not on file  Social History Narrative  . Not on file   Social Determinants of Health   Financial Resource Strain:   . Difficulty of Paying Living Expenses: Not on file  Food Insecurity:   . Worried About Charity fundraiser in the Last Year: Not on file  . Ran Out of Food in the Last Year: Not on file  Transportation Needs:   . Lack of Transportation (Medical): Not on file  . Lack of Transportation (Non-Medical): Not on file  Physical Activity:   . Days of Exercise per Week: Not on file  . Minutes of Exercise per Session: Not on file  Stress:   . Feeling of Stress : Not on file  Social Connections:   . Frequency of  Communication with Friends and Family: Not on file  . Frequency of Social Gatherings with Friends and Family: Not on file  . Attends Religious Services: Not on file  . Active Member of Clubs or Organizations: Not on file  . Attends Archivist Meetings: Not on file  . Marital Status: Not on file  Intimate Partner Violence:   . Fear of Current or Ex-Partner: Not on file  . Emotionally Abused: Not on file  . Physically Abused: Not on file  . Sexually Abused: Not on file    Allergies  Allergen Reactions  . Chantix [Varenicline Tartrate] Swelling    Lip swelling and rash  . Penicillins Anaphylaxis     Constitutional: Pt reports fatigue and chills. Denies fever, malaise, headache or abrupt weight changes.   Abdominal:  Pt reports lower abdominal tenderness and fullness.  GU: Pt reports urgency and frequency. Denies burning sensation, blood in urine, odor or discharge.  Skin: Denies redness, rashes, lesions or ulcercations.  Muskuloskeletal: Pt reports low back pain bilaterally. Denies decrease in ROM, difficulty with gait.  No other specific complaints in a complete review of systems (except as listed in HPI above).    Objective:   Physical Exam BP 132/76   Pulse 74   Temp 98.3 F (36.8  C) (Temporal)   Wt 218 lb (98.9 kg)   SpO2 98%   BMI 38.01 kg/m   Wt Readings from Last 3 Encounters:  09/21/19 205 lb (93 kg)  08/31/19 207 lb (93.9 kg)  04/06/19 222 lb (100.7 kg)    General: Appears her stated age, obese, in NAD. Cardiovascular: Normal rate and rhythm. S1,S2 noted.   Pulmonary/Chest: Normal effort and positive vesicular breath sounds. No respiratory distress. No wheezes, rales or ronchi noted.  Abdomen: Tender to palpation over the bladder area. Positive CVA tenderness. Soft. Normal bowel sounds. No distention or masses noted.          Assessment & Plan:   Urinary Frequency, Bladder Pressure, and Bilateral Low Back Pain:  Urinalysis: normal Urine  culture sent Rx sent for Macrobid 100 mg BID x 3 days Patient advised to drink plenty of fluids.   Urge Incontinence:   Discussed overactive bladder medication options, bladder retraining schedule and that decreasing caffeine intake may help with urinary urgency.   RTC as needed or if symptoms persist. Nicki Reaper, NP This visit occurred during the SARS-CoV-2 public health emergency.  Safety protocols were in place, including screening questions prior to the visit, additional usage of staff PPE, and extensive cleaning of exam room while observing appropriate contact time as indicated for disinfecting solutions.

## 2020-01-13 LAB — URINE CULTURE
MICRO NUMBER:: 10047121
SPECIMEN QUALITY:: ADEQUATE

## 2020-01-29 ENCOUNTER — Encounter: Payer: Self-pay | Admitting: Internal Medicine

## 2020-02-02 ENCOUNTER — Encounter: Payer: Self-pay | Admitting: Internal Medicine

## 2020-02-02 MED ORDER — FLUOXETINE HCL 20 MG PO CAPS: 20.0000 mg | ORAL_CAPSULE | Freq: Every day | ORAL | 1 refills | Status: DC

## 2020-02-06 ENCOUNTER — Other Ambulatory Visit: Payer: Self-pay | Admitting: Internal Medicine

## 2020-02-07 ENCOUNTER — Encounter: Payer: Self-pay | Admitting: Internal Medicine

## 2020-02-07 NOTE — Telephone Encounter (Signed)
Last filled 11/30/2019.... please advise  

## 2020-02-07 NOTE — Telephone Encounter (Signed)
On schedule tomorrow to discuss anxiety meds. Will hold off on refill until then.

## 2020-02-08 ENCOUNTER — Other Ambulatory Visit: Payer: Self-pay

## 2020-02-08 ENCOUNTER — Ambulatory Visit: Payer: 59 | Admitting: Internal Medicine

## 2020-02-08 ENCOUNTER — Ambulatory Visit (INDEPENDENT_AMBULATORY_CARE_PROVIDER_SITE_OTHER): Payer: 59 | Admitting: Internal Medicine

## 2020-02-08 ENCOUNTER — Encounter: Payer: Self-pay | Admitting: Internal Medicine

## 2020-02-08 VITALS — BP 128/84 | HR 66 | Temp 98.4°F | Wt 219.0 lb

## 2020-02-08 DIAGNOSIS — F329 Major depressive disorder, single episode, unspecified: Secondary | ICD-10-CM

## 2020-02-08 DIAGNOSIS — L659 Nonscarring hair loss, unspecified: Secondary | ICD-10-CM | POA: Diagnosis not present

## 2020-02-08 DIAGNOSIS — R635 Abnormal weight gain: Secondary | ICD-10-CM

## 2020-02-08 DIAGNOSIS — F32A Depression, unspecified: Secondary | ICD-10-CM

## 2020-02-08 DIAGNOSIS — F419 Anxiety disorder, unspecified: Secondary | ICD-10-CM | POA: Diagnosis not present

## 2020-02-08 MED ORDER — CLONAZEPAM 0.5 MG PO TABS: 0.5000 mg | ORAL_TABLET | Freq: Every day | ORAL | 0 refills | Status: DC | PRN

## 2020-02-08 MED ORDER — BUPROPION HCL ER (XL) 150 MG PO TB24: 150.0000 mg | ORAL_TABLET | Freq: Every day | ORAL | 2 refills | Status: DC

## 2020-02-08 NOTE — Patient Instructions (Signed)

## 2020-02-08 NOTE — Progress Notes (Signed)
Subjective:    Patient ID: Jasmine Miller, female    DOB: [birth year], 44 y.o.   MRN: [id number]  HPI  Pt presents to the clinic today to follow up anxiety and depression. She is currently on managed on Fluoxetine, Clonazepam and Trazadone at bedtime. She reports the Trazadone helps with sleep but she does not feel like the Fluoxetine is working. She is not sure what triggers her anxiety and depression.  She is not currently seeing a therapist. She denies SI/HI.  She is also concerned about hair thinning and weight gain. This is an ongoing issue.  She has gained 14 lbs in the last year, despite changes in diet or exercise. She denies having bald spots in her hair. She is not using any new hair products. She is not sure if this is medication related, menopause related or due to her worsening mood issues.  Review of Systems      Past Medical History:  Diagnosis Date  . Hypertension     Current Outpatient Medications  Medication Sig Dispense Refill  . amLODipine (NORVASC) 5 MG tablet Take 1 tablet (5 mg total) by mouth daily. 90 tablet 3  . clonazePAM (KLONOPIN) 0.5 MG tablet TAKE 1 TABLET BY MOUTH EVERY DAY AS NEEDED FOR ANXIETY 20 tablet 0  . FLUoxetine (PROZAC) 20 MG capsule Take 1 capsule (20 mg total) by mouth daily. 90 capsule 1  . folic acid (FOLVITE) 1 MG tablet Take 1 mg by mouth daily.    Marland Kitchen gabapentin (NEURONTIN) 100 MG capsule Take 1 capsule (100 mg total) by mouth 3 (three) times daily. (Patient taking differently: Take 100 mg by mouth 3 (three) times daily as needed. ) 30 capsule 2  . losartan (COZAAR) 100 MG tablet TAKE 1 TABLET BY MOUTH EVERY DAY 90 tablet 1  . methotrexate (RHEUMATREX) 2.5 MG tablet Take 15 mg by mouth once a week.    . nitrofurantoin, macrocrystal-monohydrate, (MACROBID) 100 MG capsule Take 1 capsule (100 mg total) by mouth 2 (two) times daily. 6 capsule 0  . traZODone (DESYREL) 50 MG tablet Take 0.5-1 tablets (25-50 mg total) by mouth at bedtime. MUST  SCHEDULE PHYSICAL 90 tablet 0   No current facility-administered medications for this visit.    Allergies  Allergen Reactions  . Chantix [Varenicline Tartrate] Swelling    Lip swelling and rash  . Penicillins Anaphylaxis    Family History  Problem Relation Age of Onset  . Breast cancer Maternal Aunt   . Breast cancer Maternal Grandmother   . Breast cancer Maternal Aunt     Social History   Socioeconomic History  . Marital status: Single    Spouse name: Not on file  . Number of children: Not on file  . Years of education: Not on file  . Highest education level: Not on file  Occupational History  . Not on file  Tobacco Use  . Smoking status: Current Every Day Smoker    Types: E-cigarettes  . Smokeless tobacco: Never Used  Substance and Sexual Activity  . Alcohol use: Yes  . Drug use: No  . Sexual activity: Not on file  Other Topics Concern  . Not on file  Social History Narrative  . Not on file   Social Determinants of Health   Financial Resource Strain:   . Difficulty of Paying Living Expenses: Not on file  Food Insecurity:   . Worried About Programme researcher, broadcasting/film/video in the Last Year: Not on file  . Ran  Out of Food in the Last Year: Not on file  Transportation Needs:   . Lack of Transportation (Medical): Not on file  . Lack of Transportation (Non-Medical): Not on file  Physical Activity:   . Days of Exercise per Week: Not on file  . Minutes of Exercise per Session: Not on file  Stress:   . Feeling of Stress : Not on file  Social Connections:   . Frequency of Communication with Friends and Family: Not on file  . Frequency of Social Gatherings with Friends and Family: Not on file  . Attends Religious Services: Not on file  . Active Member of Clubs or Organizations: Not on file  . Attends Banker Meetings: Not on file  . Marital Status: Not on file  Intimate Partner Violence:   . Fear of Current or Ex-Partner: Not on file  . Emotionally Abused: Not  on file  . Physically Abused: Not on file  . Sexually Abused: Not on file     Constitutional: Pt reports weight gain. Denies fever, malaise, fatigue, headache.  Respiratory: Denies difficulty breathing, shortness of breath, cough or sputum production.   Cardiovascular: Denies chest pain, chest tightness, palpitations or swelling in the hands or feet.  Skin: Pt reports thinning hair. Denies redness, rashes, lesions or ulcercations.  Neurological: Pt reports insomnia. Denies dizziness, difficulty with memory, difficulty with speech or problems with balance and coordination.  Psych: Pt reports anxiety and depression. Denies SI/HI.  No other specific complaints in a complete review of systems (except as listed in HPI above).  Objective:   Physical Exam  BP 128/84   Pulse 66   Temp 98.4 F (36.9 C) (Temporal)   Wt 219 lb (99.3 kg)   SpO2 98%   BMI 38.19 kg/m    Wt Readings from Last 3 Encounters:  01/12/20 218 lb (98.9 kg)  09/21/19 205 lb (93 kg)  08/31/19 207 lb (93.9 kg)    General: Appears her stated age, obese, in NAD. Skin: Warm, dry and intact. No bald patches noted of scalp. HEENT: Head: normal shape and size;  Neck:  Neck supple, trachea midline. No masses, lumps or thyromegaly present.  Cardiovascular: Normal rate and rhythm. S1,S2 noted.  No murmur, rubs or gallops noted. No JVD or BLE edema.  Pulmonary/Chest: Normal effort and positive vesicular breath sounds. No respiratory distress. No wheezes, rales or ronchi noted.  Musculoskeletal: No difficulty with gait.  Neurological: Alert and oriented.  Psychiatric: Tearful at times. Judgment and thought content normal.    BMET    Component Value Date/Time   NA 142 04/06/2019 1554   NA 137 07/17/2014 1237   K 3.8 04/06/2019 1554   K 3.6 07/17/2014 1237   CL 97 04/06/2019 1554   CL 105 07/17/2014 1237   CO2 28 04/06/2019 1554   CO2 28 07/17/2014 1237   GLUCOSE 86 04/06/2019 1554   GLUCOSE 93 09/08/2018 1529    GLUCOSE 103 (H) 07/17/2014 1237   BUN 13 04/06/2019 1554   BUN 8 07/17/2014 1237   CREATININE 0.68 04/06/2019 1554   CREATININE 0.72 07/17/2014 1237   CALCIUM 9.6 04/06/2019 1554   CALCIUM 8.5 07/17/2014 1237   GFRNONAA 108 04/06/2019 1554   GFRNONAA >60 07/17/2014 1237   GFRAA 125 04/06/2019 1554   GFRAA >60 07/17/2014 1237    Lipid Panel     Component Value Date/Time   CHOL 223 (H) 12/23/2017 1230   TRIG 262.0 (H) 12/23/2017 1230   HDL  28.60 (L) 12/23/2017 1230   CHOLHDL 8 12/23/2017 1230   VLDL 52.4 (H) 12/23/2017 1230    CBC    Component Value Date/Time   WBC 6.7 12/23/2017 1230   RBC 4.82 12/23/2017 1230   HGB 13.9 12/23/2017 1230   HGB 13.5 07/17/2014 1237   HCT 41.9 12/23/2017 1230   HCT 40.2 07/17/2014 1237   PLT 213.0 12/23/2017 1230   PLT 204 07/17/2014 1237   MCV 87.1 12/23/2017 1230   MCV 89 07/17/2014 1237   MCH 29.2 06/17/2016 1046   MCHC 33.1 12/23/2017 1230   RDW 13.9 12/23/2017 1230   RDW 13.3 07/17/2014 1237    Hgb A1C Lab Results  Component Value Date   HGBA1C 6.2 12/23/2017           Assessment & Plan:   Abnormal Weight Gain, Thinning Hair:  Will check FSH/LH, TSH, B12, Vit D and Biotin level today  Anxiety and Depression:  PHQ9 and GAD 7 score of 5 Support offered today Continue Fluoxetine Will add Wellbutrin 150 mg PO daily to regimen- discussed common side effects Continue Trazadone at bedtime Clonazepam refilled today She declines referral to a therapist at this time  Update me in 3-4 weeks via mychart and let me know how your mood is doing. I will reach out to you once your labs are back.    Webb Silversmith, NP This visit occurred during the SARS-CoV-2 public health emergency.  Safety protocols were in place, including screening questions prior to the visit, additional usage of staff PPE, and extensive cleaning of exam room while observing appropriate contact time as indicated for disinfecting solutions.

## 2020-02-09 LAB — TSH: TSH: 1.65 u[IU]/mL (ref 0.35–4.50)

## 2020-02-09 LAB — LUTEINIZING HORMONE: LH: 2.28 m[IU]/mL

## 2020-02-09 LAB — VITAMIN D 25 HYDROXY (VIT D DEFICIENCY, FRACTURES): VITD: 19.37 ng/mL — ABNORMAL LOW (ref 30.00–100.00)

## 2020-02-09 LAB — FOLLICLE STIMULATING HORMONE: FSH: 8.6 m[IU]/mL

## 2020-02-09 LAB — VITAMIN B12: Vitamin B-12: 438 pg/mL (ref 211–911)

## 2020-02-14 ENCOUNTER — Other Ambulatory Visit: Payer: Self-pay | Admitting: Internal Medicine

## 2020-02-14 LAB — BIOTIN (VITAMIN B7): Biotin (Vitamin B7): 1365.1 pg/mL (ref 221.0–3004)

## 2020-02-15 ENCOUNTER — Encounter: Payer: Self-pay | Admitting: Internal Medicine

## 2020-02-15 DIAGNOSIS — E559 Vitamin D deficiency, unspecified: Secondary | ICD-10-CM

## 2020-02-15 MED ORDER — VITAMIN D (ERGOCALCIFEROL) 1.25 MG (50000 UNIT) PO CAPS: 50000.0000 [IU] | ORAL_CAPSULE | ORAL | 0 refills | Status: DC

## 2020-03-01 ENCOUNTER — Telehealth: Payer: Self-pay | Admitting: Internal Medicine

## 2020-03-01 NOTE — Telephone Encounter (Signed)
The new medication should not be causing her to have an elevated  blood pressure unless she feels like she is having a reaction from it.

## 2020-03-03 IMAGING — US US BREAST*L* LIMITED INC AXILLA
1 series · 2 of 2 positions shown · non-contrast
Comparison: Previous exam(s).

CLINICAL DATA: Developing nipple inversion with clear and bloody
nipple discharge on the left.

EXAM:
DIGITAL DIAGNOSTIC BILATERAL MAMMOGRAM WITH CAD AND TOMO
ULTRASOUND LEFT BREAST

[Series 1: us breast*left* limited inc axilla · 0.07mm/px · 2 of 2 slices shown]
[im 1/2]
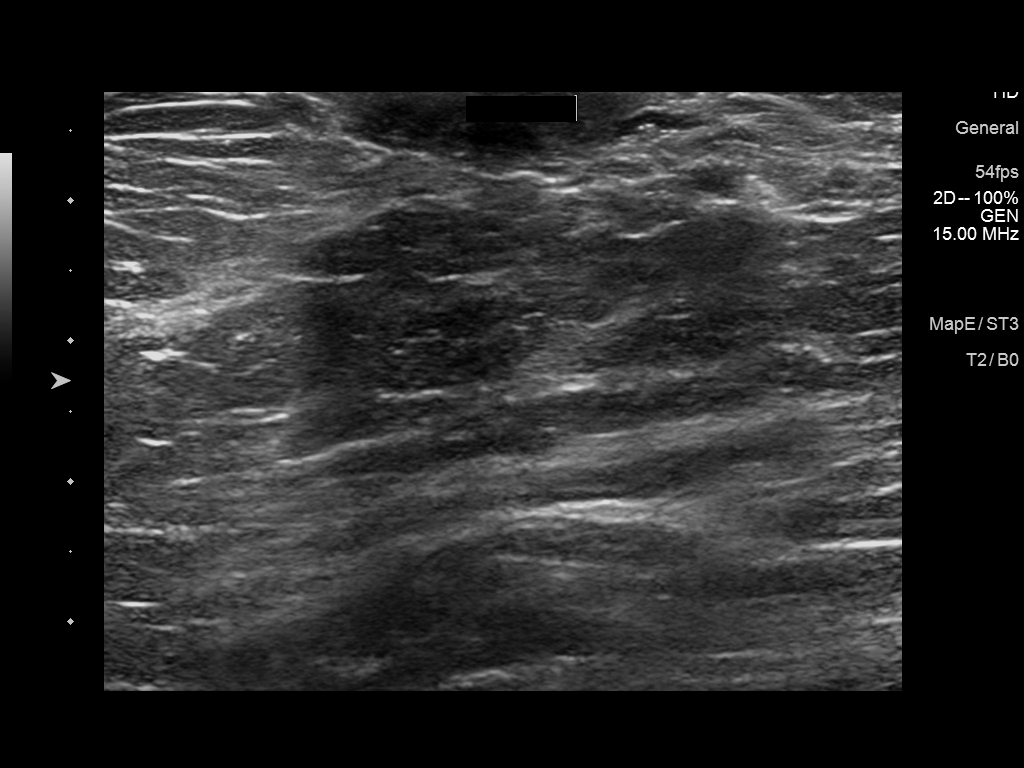
[im 2/2]
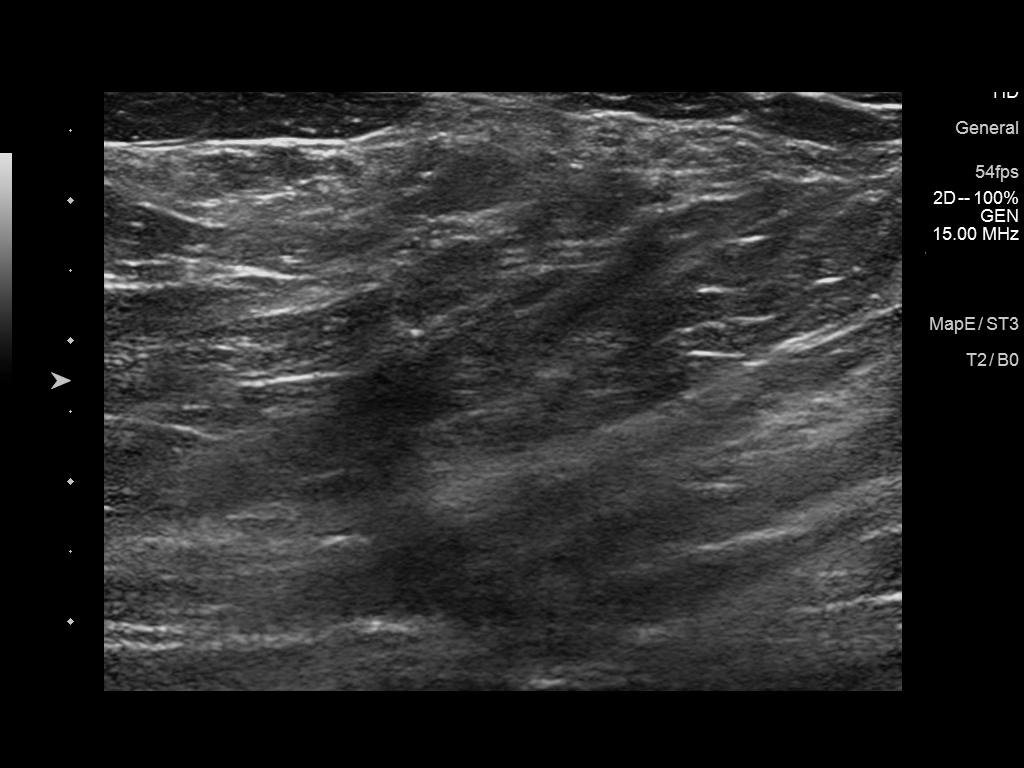

[2 of 2 positions shown; findings below may reference images not displayed]

ACR Breast Density Category b: There are scattered areas of
fibroglandular density.
FINDINGS: No suspicious masses, calcifications, or distortion on the right. No
discrete mass seen on the left. Partial nipple inversion is
identified. No definitive underlying cause noted.

Mammographic images were processed with CAD.

On physical exam, partial left nipple inversion identified.

Targeted ultrasound is performed, showing no sonographic cause for
the patient's nipple discharge and developing inversion identified.
IMPRESSION: There is partial left nipple inversion. No mammographic or
sonographic cause identified. No discrete evidence of malignancy.

RECOMMENDATION:
Recommend breast MRI for further evaluation of the patient's nipple
discharge and inversion. I explained the need for MRI to the
patient. She is aware that a negative mammogram and ultrasound in
the setting of developing nipple inversion and bloody nipple
discharge is not a complete evaluation for her symptoms.

I have discussed the findings and recommendations with the patient.
Results were also provided in writing at the conclusion of the
visit. If applicable, a reminder letter will be sent to the patient
regarding the next appointment.

BI-RADS CATEGORY  2: Benign.

## 2020-03-04 ENCOUNTER — Encounter: Payer: Self-pay | Admitting: Internal Medicine

## 2020-03-10 ENCOUNTER — Other Ambulatory Visit: Payer: Self-pay | Admitting: Internal Medicine

## 2020-03-21 ENCOUNTER — Other Ambulatory Visit: Payer: Self-pay | Admitting: Internal Medicine

## 2020-03-21 NOTE — Telephone Encounter (Signed)
Last filled 02/08/2020... please advise

## 2020-03-27 ENCOUNTER — Encounter: Payer: Self-pay | Admitting: Internal Medicine

## 2020-03-27 MED ORDER — FLUOXETINE HCL 10 MG PO CAPS: 10.0000 mg | ORAL_CAPSULE | Freq: Every day | ORAL | 0 refills | Status: DC

## 2020-04-18 ENCOUNTER — Other Ambulatory Visit: Payer: Self-pay | Admitting: Internal Medicine

## 2020-04-18 DIAGNOSIS — I10 Essential (primary) hypertension: Secondary | ICD-10-CM

## 2020-04-18 DIAGNOSIS — R140 Abdominal distension (gaseous): Secondary | ICD-10-CM

## 2020-04-18 DIAGNOSIS — K219 Gastro-esophageal reflux disease without esophagitis: Secondary | ICD-10-CM

## 2020-04-19 ENCOUNTER — Encounter: Payer: Self-pay | Admitting: Internal Medicine

## 2020-04-19 NOTE — Telephone Encounter (Signed)
mychart msg sent to pt letting her know that she is overdue for CPE, last was 11/2017

## 2020-04-23 ENCOUNTER — Other Ambulatory Visit: Payer: Self-pay | Admitting: Internal Medicine

## 2020-04-25 ENCOUNTER — Encounter: Payer: Self-pay | Admitting: Internal Medicine

## 2020-04-25 ENCOUNTER — Other Ambulatory Visit: Payer: 59

## 2020-04-26 ENCOUNTER — Encounter: Payer: Self-pay | Admitting: Internal Medicine

## 2020-04-26 ENCOUNTER — Telehealth (INDEPENDENT_AMBULATORY_CARE_PROVIDER_SITE_OTHER): Payer: 59 | Admitting: Internal Medicine

## 2020-04-26 DIAGNOSIS — R300 Dysuria: Secondary | ICD-10-CM

## 2020-04-26 DIAGNOSIS — R350 Frequency of micturition: Secondary | ICD-10-CM | POA: Diagnosis not present

## 2020-04-26 DIAGNOSIS — R109 Unspecified abdominal pain: Secondary | ICD-10-CM

## 2020-04-26 LAB — POC URINALSYSI DIPSTICK (AUTOMATED)
Bilirubin, UA: NEGATIVE
Glucose, UA: NEGATIVE
Ketones, UA: NEGATIVE
Leukocytes, UA: NEGATIVE
Nitrite, UA: NEGATIVE
Protein, UA: POSITIVE — AB
Spec Grav, UA: 1.02 (ref 1.010–1.025)
Urobilinogen, UA: 0.2 E.U./dL
pH, UA: 7 (ref 5.0–8.0)

## 2020-04-26 MED ORDER — NITROFURANTOIN MONOHYD MACRO 100 MG PO CAPS
100.0000 mg | ORAL_CAPSULE | Freq: Two times a day (BID) | ORAL | 0 refills | Status: DC
Start: 2020-04-26 — End: 2020-08-08

## 2020-04-26 NOTE — Patient Instructions (Signed)
Urinary Tract Infection, Adult A urinary tract infection (UTI) is an infection of any part of the urinary tract. The urinary tract includes:  The kidneys.  The ureters.  The bladder.  The urethra. These organs make, store, and get rid of pee (urine) in the body. What are the causes? This is caused by germs (bacteria) in your genital area. These germs grow and cause swelling (inflammation) of your urinary tract. What increases the risk? You are more likely to develop this condition if:  You have a small, thin tube (catheter) to drain pee.  You cannot control when you pee or poop (incontinence).  You are female, and: ? You use these methods to prevent pregnancy:  A medicine that kills sperm (spermicide).  A device that blocks sperm (diaphragm). ? You have low levels of a female hormone (estrogen). ? You are pregnant.  You have genes that add to your risk.  You are sexually active.  You take antibiotic medicines.  You have trouble peeing because of: ? A prostate that is bigger than normal, if you are female. ? A blockage in the part of your body that drains pee from the bladder (urethra). ? A kidney stone. ? A nerve condition that affects your bladder (neurogenic bladder). ? Not getting enough to drink. ? Not peeing often enough.  You have other conditions, such as: ? Diabetes. ? A weak disease-fighting system (immune system). ? Sickle cell disease. ? Gout. ? Injury of the spine. What are the signs or symptoms? Symptoms of this condition include:  Needing to pee right away (urgently).  Peeing often.  Peeing small amounts often.  Pain or burning when peeing.  Blood in the pee.  Pee that smells bad or not like normal.  Trouble peeing.  Pee that is cloudy.  Fluid coming from the vagina, if you are female.  Pain in the belly or lower back. Other symptoms include:  Throwing up (vomiting).  No urge to eat.  Feeling mixed up (confused).  Being tired  and grouchy (irritable).  A fever.  Watery poop (diarrhea). How is this treated? This condition may be treated with:  Antibiotic medicine.  Other medicines.  Drinking enough water. Follow these instructions at home:  Medicines  Take over-the-counter and prescription medicines only as told by your doctor.  If you were prescribed an antibiotic medicine, take it as told by your doctor. Do not stop taking it even if you start to feel better. General instructions  Make sure you: ? Pee until your bladder is empty. ? Do not hold pee for a long time. ? Empty your bladder after sex. ? Wipe from front to back after pooping if you are a female. Use each tissue one time when you wipe.  Drink enough fluid to keep your pee pale yellow.  Keep all follow-up visits as told by your doctor. This is important. Contact a doctor if:  You do not get better after 1-2 days.  Your symptoms go away and then come back. Get help right away if:  You have very bad back pain.  You have very bad pain in your lower belly.  You have a fever.  You are sick to your stomach (nauseous).  You are throwing up. Summary  A urinary tract infection (UTI) is an infection of any part of the urinary tract.  This condition is caused by germs in your genital area.  There are many risk factors for a UTI. These include having a small, thin   tube to drain pee and not being able to control when you pee or poop.  Treatment includes antibiotic medicines for germs.  Drink enough fluid to keep your pee pale yellow. This information is not intended to replace advice given to you by your health care provider. Make sure you discuss any questions you have with your health care provider. Document Revised: 12/01/2018 Document Reviewed: 06/23/2018 Elsevier Patient Education  2020 [employer]  

## 2020-04-26 NOTE — Progress Notes (Addendum)
Virtual Visit via Video Note  I connected with Jasmine Miller on 04/28/20 at  2:15 PM EDT by a video enabled telemedicine application and verified that I am speaking with the correct person using two identifiers.  Location: Patient: Home Provider: Office   I discussed the limitations of evaluation and management by telemedicine and the availability of in person appointments. The patient expressed understanding and agreed to proceed.   HPI  Pt presents to the clinic today with c/o urinary frequency, dysuria and right flank pain. This started a few days ago. She noticed blood in her urine today. She reports the flank pain has improved. She denies urgency, vaginal discharge, irritation, odor or abnormal bleeding. She denies fever, chills or nausea. She has not taken anything OTC for this. She has no history of kidney stones.   Review of Systems  Past Medical History:  Diagnosis Date  . Hypertension     Family History  Problem Relation Age of Onset  . Breast cancer Maternal Aunt   . Breast cancer Maternal Grandmother   . Breast cancer Maternal Aunt     Social History   Socioeconomic History  . Marital status: Single    Spouse name: Not on file  . Number of children: Not on file  . Years of education: Not on file  . Highest education level: Not on file  Occupational History  . Not on file  Tobacco Use  . Smoking status: Current Every Day Smoker    Types: E-cigarettes  . Smokeless tobacco: Never Used  Substance and Sexual Activity  . Alcohol use: Yes  . Drug use: No  . Sexual activity: Not on file  Other Topics Concern  . Not on file  Social History Narrative  . Not on file   Social Determinants of Health   Financial Resource Strain:   . Difficulty of Paying Living Expenses:   Food Insecurity:   . Worried About Programme researcher, broadcasting/film/video in the Last Year:   . Barista in the Last Year:   Transportation Needs:   . Freight forwarder (Medical):   Marland Kitchen Lack  of Transportation (Non-Medical):   Physical Activity:   . Days of Exercise per Week:   . Minutes of Exercise per Session:   Stress:   . Feeling of Stress :   Social Connections:   . Frequency of Communication with Friends and Family:   . Frequency of Social Gatherings with Friends and Family:   . Attends Religious Services:   . Active Member of Clubs or Organizations:   . Attends Banker Meetings:   Marland Kitchen Marital Status:   Intimate Partner Violence:   . Fear of Current or Ex-Partner:   . Emotionally Abused:   Marland Kitchen Physically Abused:   . Sexually Abused:     Allergies  Allergen Reactions  . Chantix [Varenicline Tartrate] Swelling    Lip swelling and rash  . Penicillins Anaphylaxis     Constitutional: Denies fever, malaise, fatigue, headache or abrupt weight changes.   GU: Pt reports right flank pain, frequency and pain with urination. Denies urgency, burning sensation, blood in urine, odor or discharge. Skin: Denies redness, rashes, lesions or ulcercations.   No other specific complaints in a complete review of systems (except as listed in HPI above).    Objective:   Physical Exam   Wt Readings from Last 3 Encounters:  02/08/20 219 lb (99.3 kg)  01/12/20 218 lb (98.9 kg)  09/21/19 205 lb (  93 kg)    General: Appears her stated age, obese, in NAD. Pulmonary/Chest: Normal effort. No respiratory distress.         Assessment & Plan:   Right Flank Pain, Frequency, Dysuria secondary to   Urinalysis: + protein, 3+ blood Will send urine culture OK to take AZO OTC RX for Macrobid 100 mg BID x 5 days Drink plenty of fluids  RTC as needed or if symptoms persist.  Follow Up Instructions:    I discussed the assessment and treatment plan with the patient. The patient was provided an opportunity to ask questions and all were answered. The patient agreed with the plan and demonstrated an understanding of the instructions.   The patient was advised to call back or  seek an in-person evaluation if the symptoms worsen or if the condition fails to improve as anticipated.   Webb Silversmith, NP

## 2020-04-28 LAB — URINE CULTURE
MICRO NUMBER:: 10425948
SPECIMEN QUALITY:: ADEQUATE

## 2020-05-02 ENCOUNTER — Other Ambulatory Visit: Payer: Self-pay | Admitting: Internal Medicine

## 2020-05-02 DIAGNOSIS — E559 Vitamin D deficiency, unspecified: Secondary | ICD-10-CM

## 2020-05-05 ENCOUNTER — Other Ambulatory Visit: Payer: Self-pay | Admitting: Internal Medicine

## 2020-05-06 NOTE — Telephone Encounter (Signed)
Last visit with PCP was 04/26/2020 - video visit.  No upcoming appointments scheduled at this time.  Note on medication "pt must schedule an appt for refills".   Refill denied.

## 2020-05-24 ENCOUNTER — Other Ambulatory Visit: Payer: Self-pay | Admitting: Internal Medicine

## 2020-05-24 ENCOUNTER — Encounter: Payer: Self-pay | Admitting: Internal Medicine

## 2020-05-31 ENCOUNTER — Other Ambulatory Visit: Payer: Self-pay | Admitting: Internal Medicine

## 2020-06-04 ENCOUNTER — Other Ambulatory Visit: Payer: Self-pay | Admitting: Internal Medicine

## 2020-06-05 MED ORDER — TRAZODONE HCL 50 MG PO TABS: 50.0000 mg | ORAL_TABLET | Freq: Every day | ORAL | 0 refills | Status: DC

## 2020-06-06 ENCOUNTER — Other Ambulatory Visit: Payer: Self-pay | Admitting: Internal Medicine

## 2020-06-06 NOTE — Telephone Encounter (Signed)
Last filled 03/22/2020.... please advise  

## 2020-06-17 ENCOUNTER — Encounter: Payer: Self-pay | Admitting: Internal Medicine

## 2020-06-19 ENCOUNTER — Other Ambulatory Visit: Payer: Self-pay | Admitting: Internal Medicine

## 2020-07-30 ENCOUNTER — Other Ambulatory Visit: Payer: Self-pay | Admitting: Internal Medicine

## 2020-07-30 DIAGNOSIS — K219 Gastro-esophageal reflux disease without esophagitis: Secondary | ICD-10-CM

## 2020-07-30 DIAGNOSIS — R140 Abdominal distension (gaseous): Secondary | ICD-10-CM

## 2020-07-30 DIAGNOSIS — I10 Essential (primary) hypertension: Secondary | ICD-10-CM

## 2020-08-06 ENCOUNTER — Encounter: Payer: Self-pay | Admitting: Internal Medicine

## 2020-08-08 ENCOUNTER — Other Ambulatory Visit: Payer: Self-pay

## 2020-08-08 ENCOUNTER — Encounter: Payer: Self-pay | Admitting: Family Medicine

## 2020-08-08 ENCOUNTER — Telehealth (INDEPENDENT_AMBULATORY_CARE_PROVIDER_SITE_OTHER): Payer: 59 | Admitting: Family Medicine

## 2020-08-08 DIAGNOSIS — J069 Acute upper respiratory infection, unspecified: Secondary | ICD-10-CM | POA: Diagnosis not present

## 2020-08-08 MED ORDER — BENZONATATE 200 MG PO CAPS
200.0000 mg | ORAL_CAPSULE | Freq: Three times a day (TID) | ORAL | 1 refills | Status: DC | PRN
Start: 2020-08-08 — End: 2021-03-13

## 2020-08-08 NOTE — Patient Instructions (Signed)
Drink fluids and rest  You can return to work Monday if feeling better Try generic flonase nasal spray for congestion and delsym for cough as needed Tylenol is fine for fever or pain   If you develop worse cough or severe headache or temp over 102- please get checked out at an urgent care or ER and let us know  Also if you feel short of breath or wheeze   Update if not starting to improve in a week or if worsening    When you feel better-please discuss getting a covid vaccination with your rheumatologist.

## 2020-08-08 NOTE — Assessment & Plan Note (Signed)
Tested covid negative 2 days ago  Has had virus going around work  [employer] fluids and rest [employer] for cough Adv trial of delsym and flonase for cough and congestion  Tylenol for pain or fever  Disc red flags for ER/ UC Update if not starting to improve in a week or if worsening

## 2020-08-08 NOTE — Progress Notes (Signed)
Virtual Visit via Video Note  I connected with Jasmine Miller on 08/08/20 at 12:00 PM EDT by a video enabled telemedicine application and verified that I am speaking with the correct person using two identifiers.  Location: Patient: home Provider: office   I discussed the limitations of evaluation and management by telemedicine and the availability of in person appointments. The patient expressed understanding and agreed to proceed.  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 for low grade temp with ear and head pain  Temp is 99.9 today  Started nagging headache Ears stopped up  Feels congested-cannot blow anything out   Feels hot when temp goes up -chills and hot as well Body aches also  Very tired   Cough - dry cough  Throat is also sore   Monday and Tuesday her chest felt tight -that is improved now  She was not wheezing   Several people at work were diagnosed with RSV  No covid contacts that she knows of   Has not lost taste or smell  She was covid tested Tuesday -it was negative  It was sent off  At [facility]   otc-not taking much  Tylenol for fever  She is afraid of other medicine     smoking status - she does use a vape on occasion -not regular   Takes humira for psoriasis  Has not had a covid vaccine -plans to d/w her rheumatologist  Patient Active Problem List   Diagnosis Date Noted  . Psoriasis 09/21/2019  . GAD (generalized anxiety disorder) 12/23/2017  . Essential (primary) hypertension 12/13/2014   Past Medical History:  Diagnosis Date  . Hypertension    Past Surgical History:  Procedure Laterality Date  . CHOLECYSTECTOMY N/A 06/18/2016   Procedure: LAPAROSCOPIC CHOLECYSTECTOMY WITH INTRAOPERATIVE CHOLANGIOGRAM;  Surgeon: Gladis Riffle, MD;  Location: [facility] ORS;  Service:  General;  Laterality: N/A;   Social History   Tobacco Use  . Smoking status: Current Some Day Smoker    Types: E-cigarettes  . Smokeless tobacco: Never Used  . Tobacco comment: only smokes E-Cigarettes  Substance Use Topics  . Alcohol use: Yes  . Drug use: No   Family History  Problem Relation Age of Onset  . Breast cancer Maternal Aunt   . Breast cancer Maternal Grandmother   . Breast cancer Maternal Aunt    Allergies  Allergen Reactions  . Chantix [Varenicline Tartrate] Swelling    Lip swelling and rash  . Penicillins Anaphylaxis   Current Outpatient Medications on File Prior to Visit  Medication Sig Dispense Refill  . amLODipine (NORVASC) 5 MG tablet Take 1 tablet (5 mg total) by mouth daily. 90 tablet 3  . buPROPion (WELLBUTRIN XL) 150 MG 24 hr tablet TAKE 1 TABLET BY MOUTH EVERY DAY 90 tablet 0  . clonazePAM (KLONOPIN) 0.5 MG tablet TAKE 1 TABLET BY MOUTH EVERY DAY AS NEEDED FOR ANXIETY 20 tablet 0  . FLUoxetine (PROZAC) 10 MG capsule TAKE 1 CAPSULE BY MOUTH EVERY DAY 90 capsule 0  . HUMIRA PEN 40 MG/0.4ML PNKT Inject 40 mg into the skin every 14 (fourteen) days.    Marland Kitchen losartan (COZAAR) 100 MG tablet Take 1 tablet (100 mg total) by mouth daily. MUST SCHEDULE PHYSICAL 90 tablet 0  . traZODone (DESYREL) 50 MG tablet Take 1 tablet (50 mg  total) by mouth at bedtime. 90 tablet 0   No current facility-administered medications on file prior to visit.    Review of Systems  Constitutional: Positive for chills, fever and malaise/fatigue.  HENT: Positive for congestion and sore throat. Negative for ear pain and sinus pain.   Eyes: Negative for blurred vision, discharge and redness.  Respiratory: Negative for shortness of breath, wheezing and stridor.   Cardiovascular: Negative for chest pain, palpitations and leg swelling.  Gastrointestinal: Negative for abdominal pain, diarrhea, nausea and vomiting.  Musculoskeletal: Negative for myalgias.  Skin: Negative for rash.   Neurological: Positive for headaches. Negative for dizziness.    Observations/Objective: Pt sounds slightly hoarse Not sob or wheezing Few dry coughs heard  Nl mood  Nl cognition- good historian  Not seemingly distressed  Assessment and Plan: Problem List Items Addressed This Visit      Respiratory   Viral URI with cough    Tested covid negative 2 days ago  Has had virus going around work  Enc fluids and rest [employer] for cough Adv trial of delsym and flonase for cough and congestion  Tylenol for pain or fever  Disc red flags for ER/ UC Update if not starting to improve in a week or if worsening            Follow Up Instructions: Drink fluids and rest  You can return to work Monday if feeling better Try generic flonase nasal spray for congestion and delsym for cough as needed Tylenol is fine for fever or pain   If you develop worse cough or severe headache or temp over 102- please get checked out at an urgent care or ER and let us know  Also if you feel short of breath or wheeze   Update if not starting to improve in a week or if worsening    When you feel better-please discuss getting a covid vaccination with your rheumatologist.   I discussed the assessment and treatment plan with the patient. The patient was provided an opportunity to ask questions and all were answered. The patient agreed with the plan and demonstrated an understanding of the instructions.   The patient was advised to call back or seek an in-person evaluation if the symptoms worsen or if the condition fails to improve as anticipated.  I provided 14  minutes of non-face-to-face time during this encounter.   Roxy Manns, MD

## 2020-08-28 ENCOUNTER — Other Ambulatory Visit: Payer: Self-pay | Admitting: Internal Medicine

## 2020-08-29 ENCOUNTER — Other Ambulatory Visit: Payer: Self-pay | Admitting: Internal Medicine

## 2020-09-15 ENCOUNTER — Other Ambulatory Visit: Payer: Self-pay | Admitting: Internal Medicine

## 2020-09-19 ENCOUNTER — Encounter: Payer: 59 | Admitting: Internal Medicine

## 2020-10-24 ENCOUNTER — Other Ambulatory Visit: Payer: Self-pay | Admitting: Internal Medicine

## 2020-10-24 DIAGNOSIS — I10 Essential (primary) hypertension: Secondary | ICD-10-CM

## 2020-11-14 ENCOUNTER — Encounter: Payer: 59 | Admitting: Internal Medicine

## 2020-11-23 ENCOUNTER — Other Ambulatory Visit: Payer: Self-pay | Admitting: Internal Medicine

## 2020-12-01 ENCOUNTER — Other Ambulatory Visit: Payer: Self-pay | Admitting: Internal Medicine

## 2020-12-04 ENCOUNTER — Encounter: Payer: Self-pay | Admitting: Internal Medicine

## 2020-12-06 ENCOUNTER — Other Ambulatory Visit: Payer: Self-pay | Admitting: Internal Medicine

## 2020-12-09 NOTE — Telephone Encounter (Signed)
Last filled 06/06/2020... please advise  

## 2020-12-26 ENCOUNTER — Encounter: Payer: Self-pay | Admitting: Internal Medicine

## 2020-12-26 NOTE — Telephone Encounter (Signed)
Can we call and get symptoms for both of them. If severe, or needs RX meds, add on at 4:15 for both. Otherwise, recommend zyrtec, Flonase, Mucinex, Vit C, Zing, Tylenol, rest, fluids.

## 2021-01-01 ENCOUNTER — Encounter: Payer: Self-pay | Admitting: Internal Medicine

## 2021-01-01 MED ORDER — ALBUTEROL SULFATE HFA 108 (90 BASE) MCG/ACT IN AERS: 2.0000 | INHALATION_SPRAY | Freq: Four times a day (QID) | RESPIRATORY_TRACT | 0 refills | Status: DC | PRN

## 2021-01-16 ENCOUNTER — Telehealth: Payer: Self-pay | Admitting: Internal Medicine

## 2021-01-16 ENCOUNTER — Encounter: Payer: 59 | Admitting: Internal Medicine

## 2021-01-16 NOTE — Progress Notes (Deleted)
Subjective:    Patient ID: Jasmine Miller, female    DOB: 1976/04/07, 45 y.o.   MRN: [id number]  HPI  Patient presents the clinic today for her annual exam.  She is also due to follow-up chronic conditions.  HTN: Her BP today is.  She is taking Losartan and Amlodipine as prescribed.  ECG from 05/2016 reviewed.  GAD: Persistent, managed on Fluoxetine, Wellbutrin, Clonazepam and Trazodone as needed for sleep.  She is not currently seeing a therapist.  She denies depression, SI/HI.  Psoriasis: Managed with Humira.  She follows with dermatology.  Flu: Tetanus: COVID: Pap smear: 05/2016 Mammogram: 08/2018 Colon screening: never Vision screening: Dentist:  Diet: Exercise:  Review of Systems      Past Medical History:  Diagnosis Date  . Hypertension     Current Outpatient Medications  Medication Sig Dispense Refill  . amLODipine (NORVASC) 5 MG tablet TAKE 1 TABLET BY MOUTH EVERY DAY 90 tablet 0  . clonazePAM (KLONOPIN) 0.5 MG tablet TAKE 1 TABLET BY MOUTH EVERY DAY AS NEEDED FOR ANXIETY 20 tablet 0  . FLUoxetine (PROZAC) 10 MG capsule TAKE 1 CAPSULE BY MOUTH EVERY DAY 90 capsule 0  . traZODone (DESYREL) 50 MG tablet TAKE 1 TABLET BY MOUTH EVERYDAY AT BEDTIME 90 tablet 0  . albuterol (VENTOLIN HFA) 108 (90 Base) MCG/ACT inhaler Inhale 2 puffs into the lungs every 6 (six) hours as needed for wheezing or shortness of breath. 8 g 0  . benzonatate (TESSALON) 200 MG capsule Take 1 capsule (200 mg total) by mouth 3 (three) times daily as needed for cough. Swallow whole- do not bite pill 30 capsule 1  . buPROPion (WELLBUTRIN XL) 150 MG 24 hr tablet TAKE 1 TABLET BY MOUTH EVERY DAY 90 tablet 0  . HUMIRA PEN 40 MG/0.4ML PNKT Inject 40 mg into the skin every 14 (fourteen) days.    Marland Kitchen losartan (COZAAR) 100 MG tablet Take 1 tablet (100 mg total) by mouth daily. 90 tablet 0   No current facility-administered medications for this visit.    Allergies  Allergen Reactions  . Chantix  [Varenicline Tartrate] Swelling    Lip swelling and rash  . Penicillins Anaphylaxis    Family History  Problem Relation Age of Onset  . Breast cancer Maternal Aunt   . Breast cancer Maternal Grandmother   . Breast cancer Maternal Aunt     Social History   Socioeconomic History  . Marital status: Single    Spouse name: Not on file  . Number of children: Not on file  . Years of education: Not on file  . Highest education level: Not on file  Occupational History  . Not on file  Tobacco Use  . Smoking status: Current Some Day Smoker    Types: E-cigarettes  . Smokeless tobacco: Never Used  . Tobacco comment: only smokes E-Cigarettes  Substance and Sexual Activity  . Alcohol use: Yes  . Drug use: No  . Sexual activity: Not on file  Other Topics Concern  . Not on file  Social History Narrative  . Not on file   Social Determinants of Health   Financial Resource Strain: Not on file  Food Insecurity: Not on file  Transportation Needs: Not on file  Physical Activity: Not on file  Stress: Not on file  Social Connections: Not on file  Intimate Partner Violence: Not on file     Constitutional: Denies fever, malaise, fatigue, headache or abrupt weight changes.  HEENT: Denies eye pain, eye  redness, ear pain, ringing in the ears, wax buildup, runny nose, nasal congestion, bloody nose, or sore throat. Respiratory: Denies difficulty breathing, shortness of breath, cough or sputum production.   Cardiovascular: Denies chest pain, chest tightness, palpitations or swelling in the hands or feet.  Gastrointestinal: Denies abdominal pain, bloating, constipation, diarrhea or blood in the stool.  GU: Denies urgency, frequency, pain with urination, burning sensation, blood in urine, odor or discharge. Musculoskeletal: Denies decrease in range of motion, difficulty with gait, muscle pain or joint pain and swelling.  Skin: Denies redness, rashes, lesions or ulcercations.  Neurological:  Denies dizziness, difficulty with memory, difficulty with speech or problems with balance and coordination.  Psych: Pt has a history of anxiety. Denies depression, SI/HI.  No other specific complaints in a complete review of systems (except as listed in HPI above).  Objective:   Physical Exam  There were no vitals taken for this visit. Wt Readings from Last 3 Encounters:  02/08/20 219 lb (99.3 kg)  01/12/20 218 lb (98.9 kg)  09/21/19 205 lb (93 kg)    General: Appears their stated age, well developed, well nourished in NAD. Skin: Warm, dry and intact. No rashes, lesions or ulcerations noted. HEENT: Head: normal shape and size; Eyes: sclera white, no icterus, conjunctiva pink, PERRLA and EOMs intact; Ears: Tm's gray and intact, normal light reflex; Nose: mucosa pink and moist, septum midline; Throat/Mouth: Teeth present, mucosa pink and moist, no exudate, lesions or ulcerations noted.  Neck:  Neck supple, trachea midline. No masses, lumps or thyromegaly present.  Cardiovascular: Normal rate and rhythm. S1,S2 noted.  No murmur, rubs or gallops noted. No JVD or BLE edema. No carotid bruits noted. Pulmonary/Chest: Normal effort and positive vesicular breath sounds. No respiratory distress. No wheezes, rales or ronchi noted.  Abdomen: Soft and nontender. Normal bowel sounds. No distention or masses noted. Liver, spleen and kidneys non palpable. Musculoskeletal: Normal range of motion. No signs of joint swelling. No difficulty with gait.  Neurological: Alert and oriented. Cranial nerves II-XII grossly intact. Coordination normal.  Psychiatric: Mood and affect normal. Behavior is normal. Judgment and thought content normal.   EKG:  BMET    Component Value Date/Time   NA 142 04/06/2019 1554   NA 137 07/17/2014 1237   K 3.8 04/06/2019 1554   K 3.6 07/17/2014 1237   CL 97 04/06/2019 1554   CL 105 07/17/2014 1237   CO2 28 04/06/2019 1554   CO2 28 07/17/2014 1237   GLUCOSE 86 04/06/2019  1554   GLUCOSE 93 09/08/2018 1529   GLUCOSE 103 (H) 07/17/2014 1237   BUN 13 04/06/2019 1554   BUN 8 07/17/2014 1237   CREATININE 0.68 04/06/2019 1554   CREATININE 0.72 07/17/2014 1237   CALCIUM 9.6 04/06/2019 1554   CALCIUM 8.5 07/17/2014 1237   GFRNONAA 108 04/06/2019 1554   GFRNONAA >60 07/17/2014 1237   GFRAA 125 04/06/2019 1554   GFRAA >60 07/17/2014 1237    Lipid Panel     Component Value Date/Time   CHOL 223 (H) 12/23/2017 1230   TRIG 262.0 (H) 12/23/2017 1230   HDL 28.60 (L) 12/23/2017 1230   CHOLHDL 8 12/23/2017 1230   VLDL 52.4 (H) 12/23/2017 1230    CBC    Component Value Date/Time   WBC 6.7 12/23/2017 1230   RBC 4.82 12/23/2017 1230   HGB 13.9 12/23/2017 1230   HGB 13.5 07/17/2014 1237   HCT 41.9 12/23/2017 1230   HCT 40.2 07/17/2014 1237   PLT  213.0 12/23/2017 1230   PLT 204 07/17/2014 1237   MCV 87.1 12/23/2017 1230   MCV 89 07/17/2014 1237   MCH 29.2 06/17/2016 1046   MCHC 33.1 12/23/2017 1230   RDW 13.9 12/23/2017 1230   RDW 13.3 07/17/2014 1237    Hgb A1C Lab Results  Component Value Date   HGBA1C 6.2 12/23/2017            Assessment & Plan:   Preventative Health Maintenance:  Flu Tetanus Covid Pap smear Mammogram Referral to GI for screening colonoscopy Encouraged her to consume a balanced diet and exercise regimen Advised her to see an eye doctor and dentist annually Will check CBC, CMET, Lipid, A1C and Hep C today  RTC in 1 year, sooner if needed Nicki Reaper, NP This visit occurred during the SARS-CoV-2 public health emergency.  Safety protocols were in place, including screening questions prior to the visit, additional usage of staff PPE, and extensive cleaning of exam room while observing appropriate contact time as indicated for disinfecting solutions.

## 2021-01-16 NOTE — Telephone Encounter (Signed)
Patient has had to switch pharmacies. She needs all her prescriptions sent to [facility] (right beside [employer]) in [place], [place]

## 2021-01-21 ENCOUNTER — Other Ambulatory Visit: Payer: Self-pay | Admitting: Internal Medicine

## 2021-01-21 DIAGNOSIS — I10 Essential (primary) hypertension: Secondary | ICD-10-CM

## 2021-01-30 ENCOUNTER — Ambulatory Visit: Payer: 59 | Admitting: Internal Medicine

## 2021-02-15 IMAGING — US US PELVIS COMPLETE WITH TRANSVAGINAL
1 series · 13 of 25 positions shown · non-contrast
Comparison: CT on 06/17/2016

CLINICAL DATA: Pelvic pain and pressure for 2 months. Previous
hysterectomy.



[Series 1: us pelvis complete with transvaginal · 0.24mm/px · 13 of 110 slices shown]
[im 1/110]
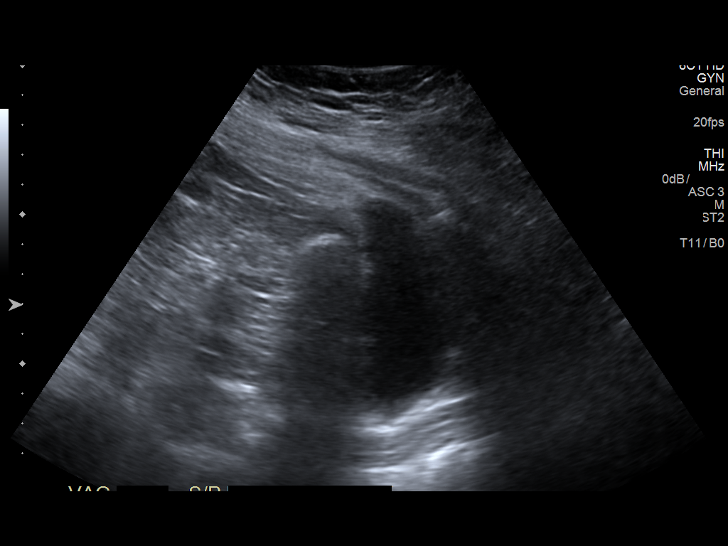
[im 10/110]
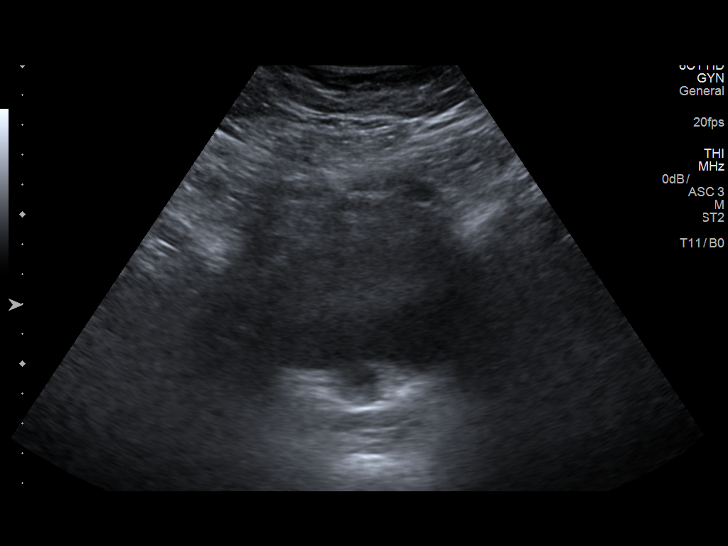
[im 19/110]
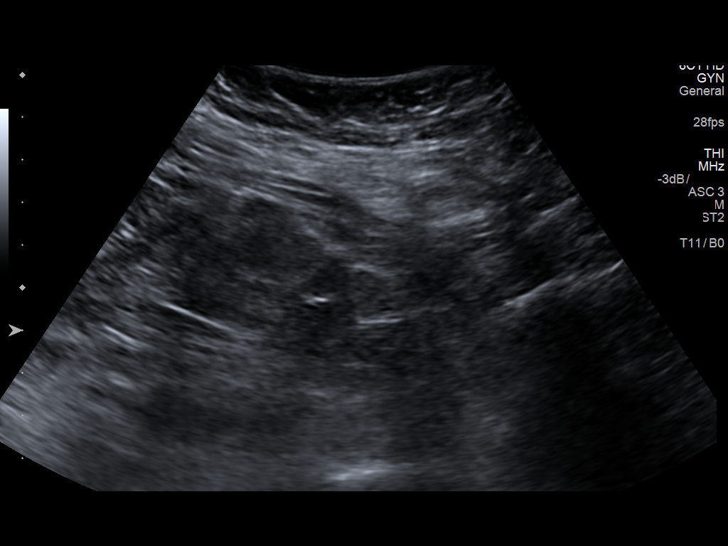
[im 28/110]
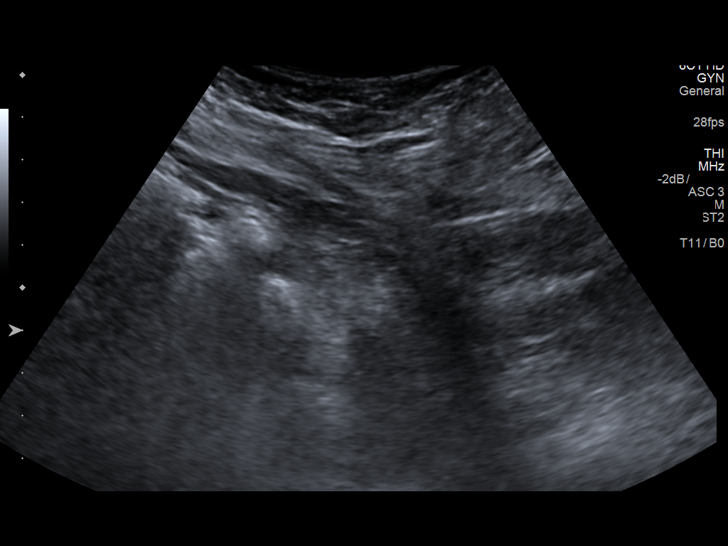
[im 37/110]
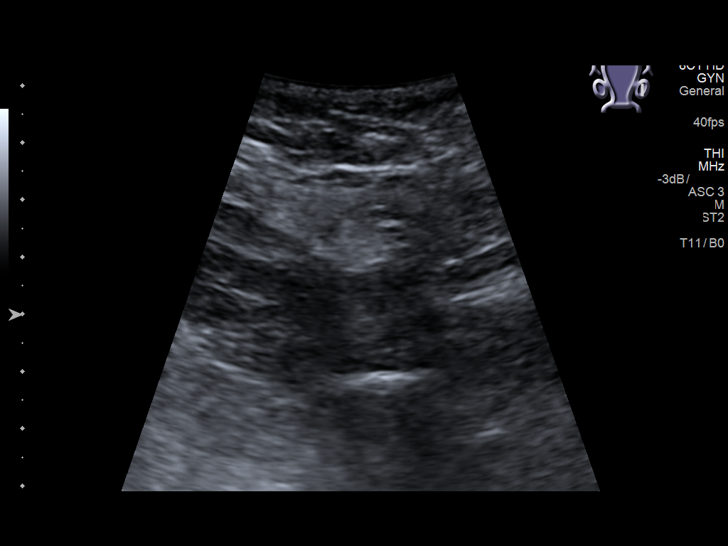
[im 46/110]
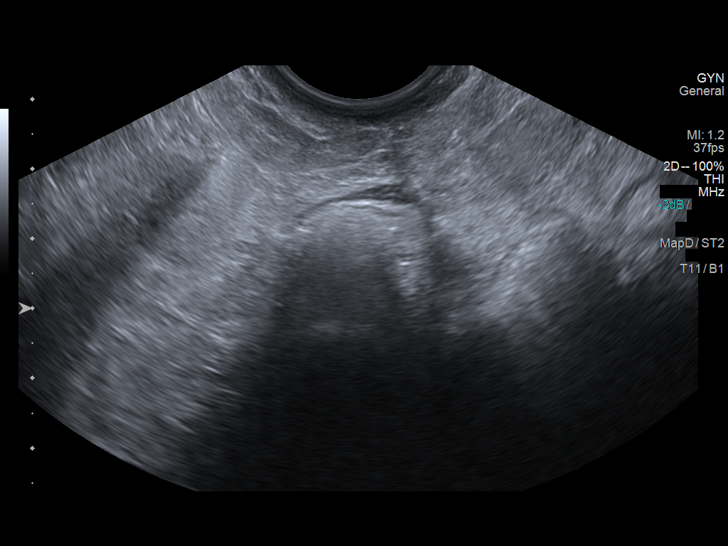
[im 55/110]
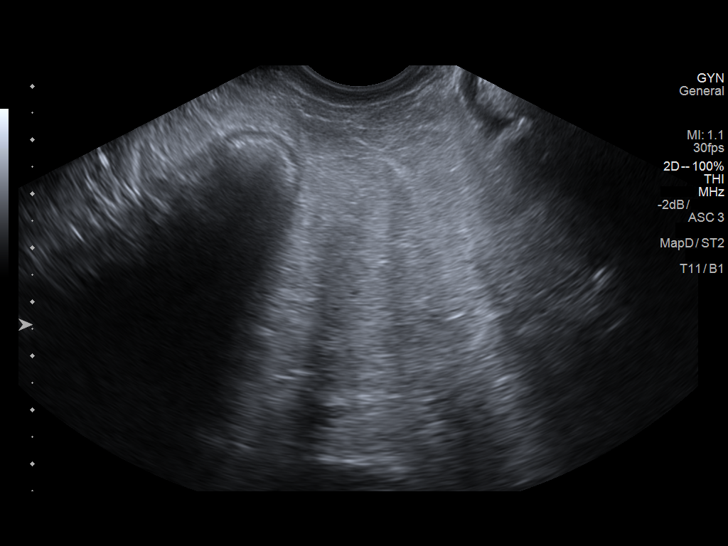
[im 64/110]
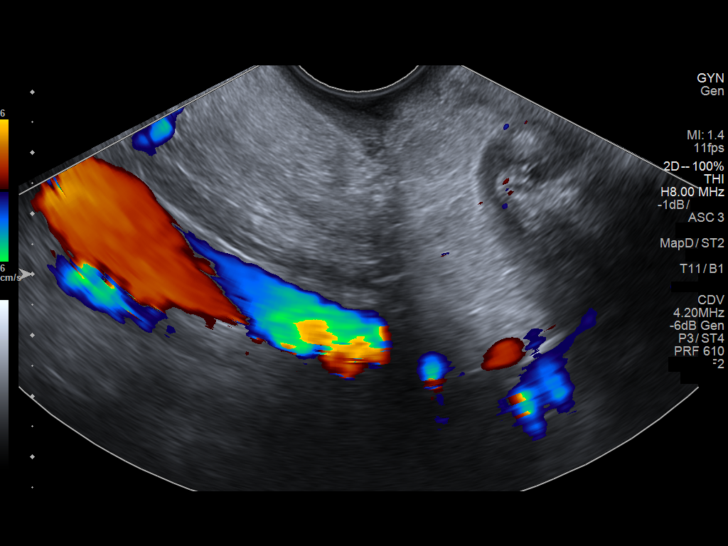
[im 73/110]
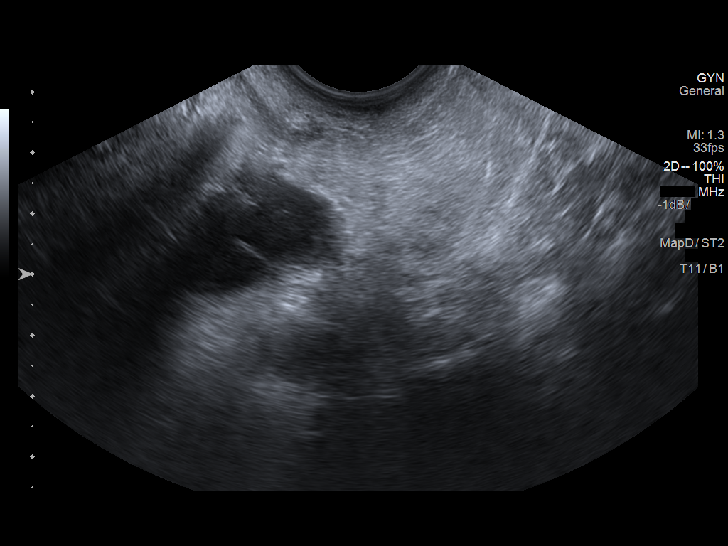
[im 82/110]
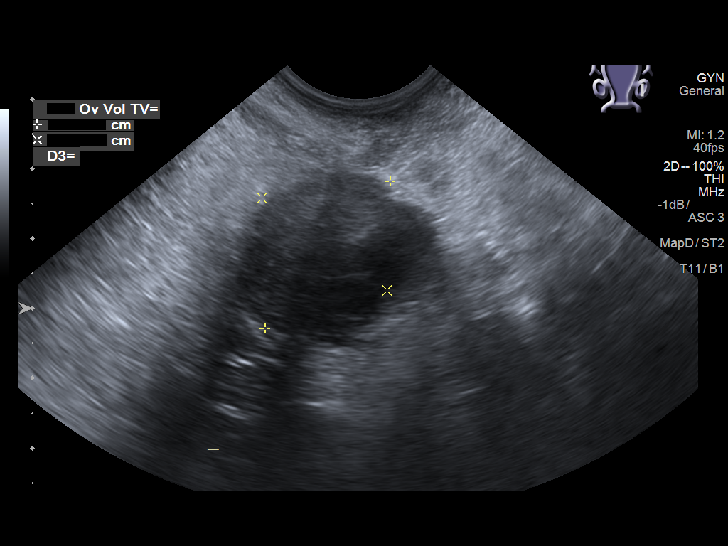
[im 91/110]
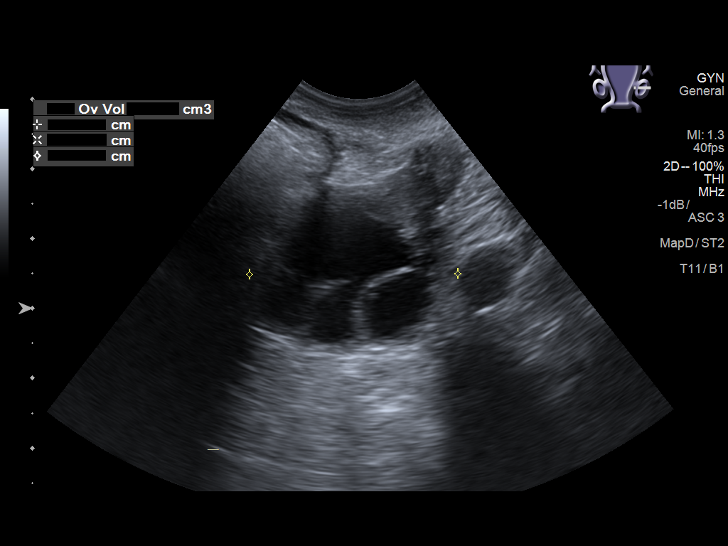
[im 100/110]
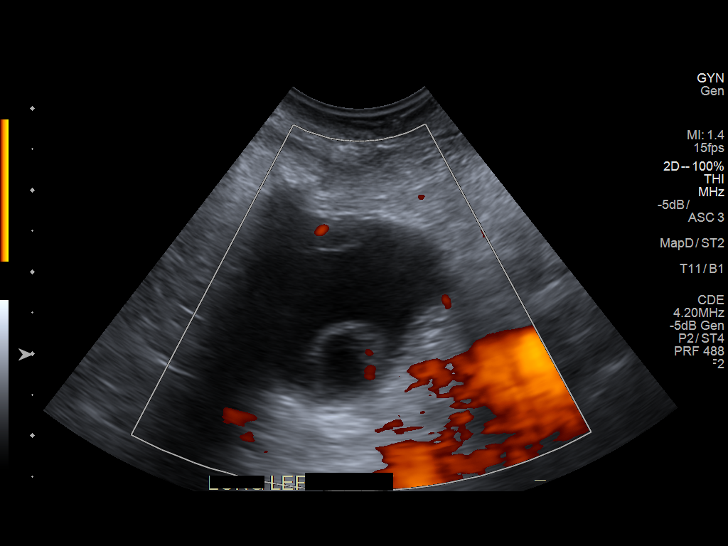
[im 110/110]
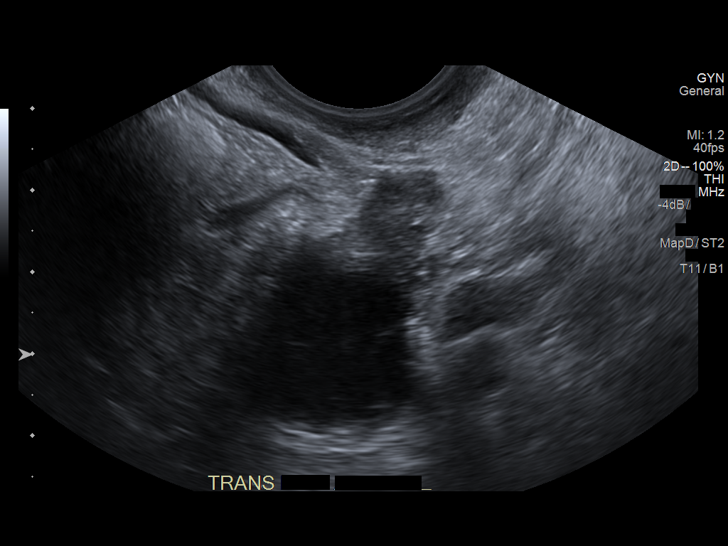

[13 of 25 positions shown; findings below may reference images not displayed]

FINDINGS: Uterus

Measurements: Surgically absent.

Endometrium

Thickness: Surgically absent.

Right ovary

Measurements: Not visualized, however no adnexal mass identified.

Left ovary

Measurements: 2.8 x 2.2 x 3.0 cm = volume: 9.6 mL. A complex cystic
lesion is seen which contains several thin internal septations. This
measures 2.4 x 2.5 x 2.4 cm, and is new since previous study in
6658. This has indeterminate but probably benign characteristics.

Other findings

No abnormal free fluid.
IMPRESSION: 2.5 cm complex cystic lesion in left ovary, which has indeterminate
but probably benign characteristics. Differential diagnosis includes
functional ovarian cyst and cystic ovarian neoplasm. Recommend
continued follow-up by ultrasound in 6-12 weeks.

## 2021-03-09 ENCOUNTER — Other Ambulatory Visit: Payer: Self-pay | Admitting: Internal Medicine

## 2021-03-12 ENCOUNTER — Other Ambulatory Visit: Payer: Self-pay

## 2021-03-12 ENCOUNTER — Other Ambulatory Visit: Payer: Self-pay | Admitting: Internal Medicine

## 2021-03-13 ENCOUNTER — Other Ambulatory Visit: Payer: Self-pay | Admitting: Internal Medicine

## 2021-03-13 ENCOUNTER — Encounter: Payer: Self-pay | Admitting: Internal Medicine

## 2021-03-13 ENCOUNTER — Ambulatory Visit (INDEPENDENT_AMBULATORY_CARE_PROVIDER_SITE_OTHER): Payer: 59 | Admitting: Internal Medicine

## 2021-03-13 VITALS — BP 132/84 | HR 80 | Temp 98.0°F | Ht 63.5 in | Wt 219.0 lb

## 2021-03-13 DIAGNOSIS — F419 Anxiety disorder, unspecified: Secondary | ICD-10-CM | POA: Diagnosis not present

## 2021-03-13 DIAGNOSIS — Z1159 Encounter for screening for other viral diseases: Secondary | ICD-10-CM | POA: Diagnosis not present

## 2021-03-13 DIAGNOSIS — Z0001 Encounter for general adult medical examination with abnormal findings: Secondary | ICD-10-CM

## 2021-03-13 DIAGNOSIS — Z114 Encounter for screening for human immunodeficiency virus [HIV]: Secondary | ICD-10-CM

## 2021-03-13 DIAGNOSIS — I10 Essential (primary) hypertension: Secondary | ICD-10-CM | POA: Diagnosis not present

## 2021-03-13 DIAGNOSIS — F411 Generalized anxiety disorder: Secondary | ICD-10-CM

## 2021-03-13 DIAGNOSIS — F32A Depression, unspecified: Secondary | ICD-10-CM

## 2021-03-13 DIAGNOSIS — L409 Psoriasis, unspecified: Secondary | ICD-10-CM | POA: Diagnosis not present

## 2021-03-13 LAB — COMPREHENSIVE METABOLIC PANEL
ALT: 17 U/L (ref 0–35)
AST: 14 U/L (ref 0–37)
Albumin: 4.2 g/dL (ref 3.5–5.2)
Alkaline Phosphatase: 48 U/L (ref 39–117)
BUN: 17 mg/dL (ref 6–23)
CO2: 27 mEq/L (ref 19–32)
Calcium: 9.1 mg/dL (ref 8.4–10.5)
Chloride: 103 mEq/L (ref 96–112)
Creatinine, Ser: 0.61 mg/dL (ref 0.40–1.20)
GFR: 108.73 mL/min (ref >60.00)
Glucose, Bld: 82 mg/dL (ref 70–99)
Potassium: 3.7 mEq/L (ref 3.5–5.1)
Sodium: 138 mEq/L (ref 135–145)
Total Bilirubin: 0.3 mg/dL (ref 0.2–1.2)
Total Protein: 7.4 g/dL (ref 6.0–8.3)

## 2021-03-13 LAB — CBC
HCT: 37.8 % (ref 36.0–46.0)
Hemoglobin: 12.9 g/dL (ref 12.0–15.0)
MCHC: 34.1 g/dL (ref 30.0–36.0)
MCV: 83.3 fl (ref 78.0–100.0)
Platelets: 195 10*3/uL (ref 150.0–400.0)
RBC: 4.54 Mil/uL (ref 3.87–5.11)
RDW: 13 % (ref 11.5–15.5)
WBC: 6 10*3/uL (ref 4.0–10.5)

## 2021-03-13 LAB — LIPID PANEL
Cholesterol: 249 mg/dL — ABNORMAL HIGH (ref 0–200)
HDL: 47.5 mg/dL (ref >39.00)
NonHDL: 201.59
Total CHOL/HDL Ratio: 5
Triglycerides: 342 mg/dL — ABNORMAL HIGH (ref 0.0–149.0)
VLDL: 68.4 mg/dL — ABNORMAL HIGH (ref 0.0–40.0)

## 2021-03-13 LAB — TSH: TSH: 1.99 u[IU]/mL (ref 0.35–4.50)

## 2021-03-13 LAB — LDL CHOLESTEROL, DIRECT: Direct LDL: 153 mg/dL

## 2021-03-13 LAB — HEMOGLOBIN A1C: Hgb A1c MFr Bld: 5.9 % (ref 4.6–6.5)

## 2021-03-13 NOTE — Progress Notes (Signed)
Subjective:    Patient ID: Jasmine Miller, female    DOB: 07-09-1976, 45 y.o.   MRN: [id number]  HPI  Patient presents the clinic today for her annual exam.  She is also due to follow-up chronic conditions.  HTN: Her BP today is 132/84.  She is taking Amlodipine and Losartan as prescribed.  ECG from 05/2016 reviewed.  Anxiety and Depression: Persistent, managed on Fluoxetine, Wellbutrin, Clonazepam and Trazodone.  She is not currently seeing a therapist.  She denies SI/HI.  Psoriatic Arthritis: Managed on Humira.  She is following with rheumatology  Flu: Never Tetanus: >10 years ago Covid: Never Mammogram: 08/2018 Pap smear: Hysterectomy Vision screening as needed Dentist: Biannually  Diet: She does eat meat. She consumes fruits and veggies. She tries to avoid fried foods. She drinks mostly water. Exercise: None  Review of Systems  Past Medical History:  Diagnosis Date  . Hypertension     Current Outpatient Medications  Medication Sig Dispense Refill  . albuterol (VENTOLIN HFA) 108 (90 Base) MCG/ACT inhaler Inhale 2 puffs into the lungs every 6 (six) hours as needed for wheezing or shortness of breath. 8 g 0  . amLODipine (NORVASC) 5 MG tablet TAKE 1 TABLET BY MOUTH EVERY DAY 90 tablet 0  . buPROPion (WELLBUTRIN XL) 150 MG 24 hr tablet TAKE 1 TABLET BY MOUTH EVERY DAY 90 tablet 0  . clonazePAM (KLONOPIN) 0.5 MG tablet TAKE 1 TABLET BY MOUTH EVERY DAY AS NEEDED FOR ANXIETY 20 tablet 0  . FLUoxetine (PROZAC) 10 MG capsule TAKE 1 CAPSULE BY MOUTH EVERY DAY 90 capsule 0  . HUMIRA PEN 40 MG/0.4ML PNKT Inject 40 mg into the skin every 14 (fourteen) days.    Marland Kitchen losartan (COZAAR) 100 MG tablet TAKE 1 TABLET BY MOUTH EVERY DAY 90 tablet 0  . traZODone (DESYREL) 50 MG tablet TAKE 1 TABLET BY MOUTH EVERYDAY AT BEDTIME 90 tablet 0   No current facility-administered medications for this visit.    Allergies  Allergen Reactions  . Chantix [Varenicline Tartrate] Swelling    Lip  swelling and rash  . Penicillins Anaphylaxis    Family History  Problem Relation Age of Onset  . Breast cancer Maternal Aunt   . Breast cancer Maternal Grandmother   . Breast cancer Maternal Aunt     Social History   Socioeconomic History  . Marital status: Single    Spouse name: Not on file  . Number of children: Not on file  . Years of education: Not on file  . Highest education level: Not on file  Occupational History  . Not on file  Tobacco Use  . Smoking status: Current Some Day Smoker    Types: E-cigarettes  . Smokeless tobacco: Never Used  . Tobacco comment: only smokes E-Cigarettes  Substance and Sexual Activity  . Alcohol use: Yes  . Drug use: No  . Sexual activity: Not on file  Other Topics Concern  . Not on file  Social History Narrative  . Not on file   Social Determinants of Health   Financial Resource Strain: Not on file  Food Insecurity: Not on file  Transportation Needs: Not on file  Physical Activity: Not on file  Stress: Not on file  Social Connections: Not on file  Intimate Partner Violence: Not on file     Constitutional: Pt reports weight gain. Denies fever, malaise, fatigue, headache or abrupt weight changes.  HEENT: Denies eye pain, eye redness, ear pain, ringing in the ears, wax buildup,  runny nose, nasal congestion, bloody nose, or sore throat. Respiratory: Denies difficulty breathing, shortness of breath, cough or sputum production.   Cardiovascular: Denies chest pain, chest tightness, palpitations or swelling in the hands or feet.  Gastrointestinal: Denies abdominal pain, bloating, constipation, diarrhea or blood in the stool.  GU: Denies urgency, frequency, pain with urination, burning sensation, blood in urine, odor or discharge. Musculoskeletal: Pt reports joint pain and swelling. Denies decrease in range of motion, difficulty with gait, muscle pain.  Skin: Denies redness, rashes, lesions or ulcercations.  Neurological: Denies  dizziness, difficulty with memory, difficulty with speech or problems with balance and coordination.  Psych: Pt has a history of anxiety and depression. Denies SI/HI.  No other specific complaints in a complete review of systems (except as listed in HPI above).     Objective:   Physical Exam  BP 132/84   Pulse 80   Temp 98 F (36.7 C) (Temporal)   Ht 5' 3.5" (1.613 m)   Wt 99.3 kg   SpO2 98%   BMI 38.19 kg/m   Wt Readings from Last 3 Encounters:  02/08/20 219 lb (99.3 kg)  01/12/20 218 lb (98.9 kg)  09/21/19 205 lb (93 kg)    General: Appears her stated age, obese, in NAD. Skin: Warm, dry and intact. No rashes noted. HEENT: Head: normal shape and size; Eyes: sclera white, no icterus, conjunctiva pink, PERRLA and EOMs intact; Neck:  Neck supple, trachea midline. No masses, lumps or thyromegaly present.  Cardiovascular: Normal rate and rhythm. S1,S2 noted.  No murmur, rubs or gallops noted. No JVD or BLE edema.  Pulmonary/Chest: Normal effort and positive vesicular breath sounds. No respiratory distress. No wheezes, rales or ronchi noted.  Abdomen: Soft and nontender. Normal bowel sounds. No distention or masses noted. Liver, spleen and kidneys non palpable. Musculoskeletal: Strength 5/5 BUE/BLE. No difficulty with gait.  Neurological: Alert and oriented. Cranial nerves II-XII grossly intact. Coordination normal.  Psychiatric: Mood and affect normal. Behavior is normal. Judgment and thought content normal.     BMET    Component Value Date/Time   NA 142 04/06/2019 1554   NA 137 07/17/2014 1237   K 3.8 04/06/2019 1554   K 3.6 07/17/2014 1237   CL 97 04/06/2019 1554   CL 105 07/17/2014 1237   CO2 28 04/06/2019 1554   CO2 28 07/17/2014 1237   GLUCOSE 86 04/06/2019 1554   GLUCOSE 93 09/08/2018 1529   GLUCOSE 103 (H) 07/17/2014 1237   BUN 13 04/06/2019 1554   BUN 8 07/17/2014 1237   CREATININE 0.68 04/06/2019 1554   CREATININE 0.72 07/17/2014 1237   CALCIUM 9.6  04/06/2019 1554   CALCIUM 8.5 07/17/2014 1237   GFRNONAA 108 04/06/2019 1554   GFRNONAA >60 07/17/2014 1237   GFRAA 125 04/06/2019 1554   GFRAA >60 07/17/2014 1237    Lipid Panel     Component Value Date/Time   CHOL 223 (H) 12/23/2017 1230   TRIG 262.0 (H) 12/23/2017 1230   HDL 28.60 (L) 12/23/2017 1230   CHOLHDL 8 12/23/2017 1230   VLDL 52.4 (H) 12/23/2017 1230    CBC    Component Value Date/Time   WBC 6.7 12/23/2017 1230   RBC 4.82 12/23/2017 1230   HGB 13.9 12/23/2017 1230   HGB 13.5 07/17/2014 1237   HCT 41.9 12/23/2017 1230   HCT 40.2 07/17/2014 1237   PLT 213.0 12/23/2017 1230   PLT 204 07/17/2014 1237   MCV 87.1 12/23/2017 1230   MCV 89  07/17/2014 1237   MCH 29.2 06/17/2016 1046   MCHC 33.1 12/23/2017 1230   RDW 13.9 12/23/2017 1230   RDW 13.3 07/17/2014 1237    Hgb A1C Lab Results  Component Value Date   HGBA1C 6.2 12/23/2017           Assessment & Plan:   Preventative Health Maintenance:  She declines flu shot She declines tetanus shot She declines COVID vaccine She will start screening mammograms at 45 She no longer needs cervical cancer screening Encouraged her to consume a balanced diet and exercise regimen Advised her to see an eye doctor and dentist annually We will check CBC, c-Met, TSH, lipid, A1c, HIV and hep C today  RTC in 1 year, sooner if needed Webb Silversmith, NP This visit occurred during the SARS-CoV-2 public health emergency.  Safety protocols were in place, including screening questions prior to the visit, additional usage of staff PPE, and extensive cleaning of exam room while observing appropriate contact time as indicated for disinfecting solutions.

## 2021-03-14 LAB — HIV ANTIBODY (ROUTINE TESTING W REFLEX): HIV 1&2 Ab, 4th Generation: NONREACTIVE

## 2021-03-14 LAB — HEPATITIS C ANTIBODY
Hepatitis C Ab: NONREACTIVE
SIGNAL TO CUT-OFF: 0.02 (ref <1.00)

## 2021-03-14 MED ORDER — CLONAZEPAM 0.5 MG PO TABS: 0.5000 mg | ORAL_TABLET | Freq: Every day | ORAL | 0 refills | Status: DC | PRN

## 2021-03-14 MED ORDER — TRAZODONE HCL 50 MG PO TABS: 50.0000 mg | ORAL_TABLET | Freq: Every day | ORAL | 3 refills | Status: DC

## 2021-03-14 MED ORDER — AMLODIPINE BESYLATE 5 MG PO TABS: 5.0000 mg | ORAL_TABLET | Freq: Every day | ORAL | 3 refills | Status: DC

## 2021-03-14 MED ORDER — LOSARTAN POTASSIUM 100 MG PO TABS: 100.0000 mg | ORAL_TABLET | Freq: Every day | ORAL | 3 refills | Status: DC

## 2021-03-14 MED ORDER — BUPROPION HCL ER (XL) 150 MG PO TB24: 150.0000 mg | ORAL_TABLET | Freq: Every day | ORAL | 3 refills | Status: DC

## 2021-03-14 MED ORDER — FLUOXETINE HCL 10 MG PO CAPS: 10.0000 mg | ORAL_CAPSULE | Freq: Every day | ORAL | 3 refills | Status: DC

## 2021-03-14 NOTE — Assessment & Plan Note (Signed)
Continue Humira She will continue to follow with rheumatology

## 2021-03-14 NOTE — Assessment & Plan Note (Signed)
Continue fluoxetine, wellbutrin, clonazepam and trazodone Support offered today We will monitor

## 2021-03-14 NOTE — Telephone Encounter (Signed)
Klonopin last filled 12/09/2020... please advise

## 2021-03-14 NOTE — Patient Instructions (Signed)
Health Maintenance, Female Adopting a healthy lifestyle and getting preventive care are important in promoting health and wellness. Ask your health care provider about:  The right schedule for you to have regular tests and exams.  Things you can do on your own to prevent diseases and keep yourself healthy. What should I know about diet, weight, and exercise? Eat a healthy diet  Eat a diet that includes plenty of vegetables, fruits, low-fat dairy products, and lean protein.  Do not eat a lot of foods that are high in solid fats, added sugars, or sodium.   Maintain a healthy weight Body mass index (BMI) is used to identify weight problems. It estimates body fat based on height and weight. Your health care provider can help determine your BMI and help you achieve or maintain a healthy weight. Get regular exercise Get regular exercise. This is one of the most important things you can do for your health. Most adults should:  Exercise for at least 150 minutes each week. The exercise should increase your heart rate and make you sweat (moderate-intensity exercise).  Do strengthening exercises at least twice a week. This is in addition to the moderate-intensity exercise.  Spend less time sitting. Even light physical activity can be beneficial. Watch cholesterol and blood lipids Have your blood tested for lipids and cholesterol at 45 years of age, then have this test every 5 years. Have your cholesterol levels checked more often if:  Your lipid or cholesterol levels are high.  You are older than 45 years of age.  You are at high risk for heart disease. What should I know about cancer screening? Depending on your health history and family history, you may need to have cancer screening at various ages. This may include screening for:  Breast cancer.  Cervical cancer.  Colorectal cancer.  Skin cancer.  Lung cancer. What should I know about heart disease, diabetes, and high blood  pressure? Blood pressure and heart disease  High blood pressure causes heart disease and increases the risk of stroke. This is more likely to develop in people who have high blood pressure readings, are of African descent, or are overweight.  Have your blood pressure checked: ? Every 3-5 years if you are 18-39 years of age. ? Every year if you are 40 years old or older. Diabetes Have regular diabetes screenings. This checks your fasting blood sugar level. Have the screening done:  Once every three years after age 40 if you are at a normal weight and have a low risk for diabetes.  More often and at a younger age if you are overweight or have a high risk for diabetes. What should I know about preventing infection? Hepatitis B If you have a higher risk for hepatitis B, you should be screened for this virus. Talk with your health care provider to find out if you are at risk for hepatitis B infection. Hepatitis C Testing is recommended for:  Everyone born from 1945 through 1965.  Anyone with known risk factors for hepatitis C. Sexually transmitted infections (STIs)  Get screened for STIs, including gonorrhea and chlamydia, if: ? You are sexually active and are younger than 45 years of age. ? You are older than 45 years of age and your health care provider tells you that you are at risk for this type of infection. ? Your sexual activity has changed since you were last screened, and you are at increased risk for chlamydia or gonorrhea. Ask your health care provider   if you are at risk.  Ask your health care provider about whether you are at high risk for HIV. Your health care provider may recommend a prescription medicine to help prevent HIV infection. If you choose to take medicine to prevent HIV, you should first get tested for HIV. You should then be tested every 3 months for as long as you are taking the medicine. Pregnancy  If you are about to stop having your period (premenopausal) and  you may become pregnant, seek counseling before you get pregnant.  Take 400 to 800 micrograms (mcg) of folic acid every day if you become pregnant.  Ask for birth control (contraception) if you want to prevent pregnancy. Osteoporosis and menopause Osteoporosis is a disease in which the bones lose minerals and strength with aging. This can result in bone fractures. If you are 65 years old or older, or if you are at risk for osteoporosis and fractures, ask your health care provider if you should:  Be screened for bone loss.  Take a calcium or vitamin D supplement to lower your risk of fractures.  Be given hormone replacement therapy (HRT) to treat symptoms of menopause. Follow these instructions at home: Lifestyle  Do not use any products that contain nicotine or tobacco, such as cigarettes, e-cigarettes, and chewing tobacco. If you need help quitting, ask your health care provider.  Do not use street drugs.  Do not share needles.  Ask your health care provider for help if you need support or information about quitting drugs. Alcohol use  Do not drink alcohol if: ? Your health care provider tells you not to drink. ? You are pregnant, may be pregnant, or are planning to become pregnant.  If you drink alcohol: ? Limit how much you use to 0-1 drink a day. ? Limit intake if you are breastfeeding.  Be aware of how much alcohol is in your drink. In the [place] one drink equals one 12 oz bottle of beer (355 mL), one 5 oz glass of wine (148 mL), or one 1 oz glass of hard liquor (44 mL). General instructions  Schedule regular health, dental, and eye exams.  Stay current with your vaccines.  Tell your health care provider if: ? You often feel depressed. ? You have ever been abused or do not feel safe at home. Summary  Adopting a healthy lifestyle and getting preventive care are important in promoting health and wellness.  Follow your health care provider's instructions about healthy  diet, exercising, and getting tested or screened for diseases.  Follow your health care provider's instructions on monitoring your cholesterol and blood pressure. This information is not intended to replace advice given to you by your health care provider. Make sure you discuss any questions you have with your health care provider. Document Revised: 12/07/2018 Document Reviewed: 12/07/2018 Elsevier Patient Education  2021 [employer]  

## 2021-03-14 NOTE — Assessment & Plan Note (Signed)
Controlled on amlodipine and losartan Reinforced DASH diet and exercise for weight loss C met today

## 2021-03-19 ENCOUNTER — Encounter: Payer: Self-pay | Admitting: Internal Medicine

## 2021-03-19 DIAGNOSIS — E7800 Pure hypercholesterolemia, unspecified: Secondary | ICD-10-CM

## 2021-03-19 MED ORDER — SIMVASTATIN 10 MG PO TABS: 10.0000 mg | ORAL_TABLET | Freq: Every day | ORAL | 2 refills | Status: DC

## 2021-03-19 NOTE — Addendum Note (Signed)
Addended by: Roena Malady on: 03/19/2021 04:15 PM   Modules accepted: Orders

## 2021-03-25 ENCOUNTER — Encounter: Payer: Self-pay | Admitting: Internal Medicine

## 2021-03-25 DIAGNOSIS — R4586 Emotional lability: Secondary | ICD-10-CM

## 2021-03-25 DIAGNOSIS — R61 Generalized hyperhidrosis: Secondary | ICD-10-CM

## 2021-05-15 ENCOUNTER — Other Ambulatory Visit: Payer: Self-pay | Admitting: Internal Medicine

## 2021-05-22 ENCOUNTER — Other Ambulatory Visit: Payer: Self-pay | Admitting: Internal Medicine

## 2021-05-22 NOTE — Telephone Encounter (Signed)
Patient called and advised the need for a f/u at new location with [facility], patient agreed. Appointment scheduled for Thursday, 06/05/21 at 1540.

## 2021-05-22 NOTE — Telephone Encounter (Signed)
Requested medication (s) are due for refill today: Yes  Requested medication (s) are on the active medication list: Yes  Last refill:  03/14/21  Future visit scheduled: Yes  Notes to clinic:  Unable to refill per protocol, cannot delgate.      Requested Prescriptions  Pending Prescriptions Disp Refills   clonazePAM (KLONOPIN) 0.5 MG tablet 20 tablet 0    Sig: Take 1 tablet (0.5 mg total) by mouth daily as needed for anxiety.      Not Delegated - Psychiatry:  Anxiolytics/Hypnotics Failed - 05/22/2021  5:32 PM      Failed - This refill cannot be delegated      Failed - Urine Drug Screen completed in last 360 days      Failed - Valid encounter within last 6 months    Recent Outpatient Visits   None     Future Appointments             In 2 weeks Baity, Salvadore Oxford, NP [facility]

## 2021-05-22 NOTE — Telephone Encounter (Signed)
Medication: clonazePAM (KLONOPIN) 0.5 MG tablet [[id number]]  Has the patient contacted their pharmacy?YES  (Agent: If no, request that the patient contact the pharmacy for the refill.) (Agent: If yes, when and what did the pharmacy advise?)  Preferred Pharmacy (with phone number or street name): [id number] [place], [place] - [address] AT [facility] [address] [place] [address] Phone: [phone] Fax: [phone] Hours: Not open 24 hours    Agent: Please be advised that RX refills may take up to 3 business days. We ask that you follow-up with your pharmacy.

## 2021-05-23 MED ORDER — CLONAZEPAM 0.5 MG PO TABS: 0.5000 mg | ORAL_TABLET | Freq: Every day | ORAL | 0 refills | Status: DC | PRN

## 2021-06-05 ENCOUNTER — Ambulatory Visit: Payer: Self-pay | Admitting: Internal Medicine

## 2021-06-07 ENCOUNTER — Other Ambulatory Visit: Payer: Self-pay | Admitting: Internal Medicine

## 2021-06-07 DIAGNOSIS — E7800 Pure hypercholesterolemia, unspecified: Secondary | ICD-10-CM

## 2021-06-07 NOTE — Telephone Encounter (Signed)
meds already ordered today 06/07/21

## 2021-06-12 ENCOUNTER — Ambulatory Visit (INDEPENDENT_AMBULATORY_CARE_PROVIDER_SITE_OTHER): Payer: 59 | Admitting: Internal Medicine

## 2021-06-12 ENCOUNTER — Other Ambulatory Visit: Payer: Self-pay

## 2021-06-12 ENCOUNTER — Encounter: Payer: Self-pay | Admitting: Internal Medicine

## 2021-06-12 VITALS — BP 144/84 | HR 80 | Ht 63.0 in | Wt 224.2 lb

## 2021-06-12 DIAGNOSIS — L409 Psoriasis, unspecified: Secondary | ICD-10-CM

## 2021-06-12 DIAGNOSIS — E782 Mixed hyperlipidemia: Secondary | ICD-10-CM

## 2021-06-12 DIAGNOSIS — R7303 Prediabetes: Secondary | ICD-10-CM | POA: Diagnosis not present

## 2021-06-12 DIAGNOSIS — I10 Essential (primary) hypertension: Secondary | ICD-10-CM | POA: Diagnosis not present

## 2021-06-12 DIAGNOSIS — E785 Hyperlipidemia, unspecified: Secondary | ICD-10-CM | POA: Insufficient documentation

## 2021-06-12 DIAGNOSIS — F419 Anxiety disorder, unspecified: Secondary | ICD-10-CM

## 2021-06-12 DIAGNOSIS — Z6839 Body mass index (BMI) 39.0-39.9, adult: Secondary | ICD-10-CM | POA: Insufficient documentation

## 2021-06-12 DIAGNOSIS — E66811 Obesity, class 1: Secondary | ICD-10-CM | POA: Insufficient documentation

## 2021-06-12 DIAGNOSIS — Z6835 Body mass index (BMI) 35.0-35.9, adult: Secondary | ICD-10-CM

## 2021-06-12 DIAGNOSIS — E6609 Other obesity due to excess calories: Secondary | ICD-10-CM | POA: Insufficient documentation

## 2021-06-12 DIAGNOSIS — F32A Depression, unspecified: Secondary | ICD-10-CM

## 2021-06-12 MED ORDER — FUROSEMIDE 20 MG PO TABS: 20.0000 mg | ORAL_TABLET | Freq: Every day | ORAL | 0 refills | Status: DC

## 2021-06-12 MED ORDER — SAXENDA 18 MG/3ML ~~LOC~~ SOPN: 0.6000 mg | PEN_INJECTOR | Freq: Every day | SUBCUTANEOUS | 0 refills | Status: DC

## 2021-06-12 MED ORDER — INSULIN PEN NEEDLE 31G X 5 MM MISC: 1.0000 | Freq: Every day | 0 refills | Status: DC

## 2021-06-12 NOTE — Assessment & Plan Note (Signed)
Continue Fluoxetine, Wellbutrin and Clonazepam Support offered Will monitor

## 2021-06-12 NOTE — Patient Instructions (Signed)
?????? ??????? ???????? ?????? ????? Calorie Counting for Weight Loss ??????? ???????? ?? ????? ??????. ?????? ???? ?????? ??? ??? ???? ?? ??????? ???????? ?? ????? ??? ?????? ???? ????? ????? ???????. ???? ????? ????? ???? ?? ???????? ??? ????? ?????? ???? ??? ?????? ????? ??? ????? ???? ??????? ???????? ???? ?????? ?? ??? ????. ??? ??? ??? ????? ??? ????? ?????? ??? ??? ??????? ????? ???? ???? ?????? ??????? ?????? ??? ?????? ???? ???????. ???? ??????? ??????? ???????? ?????? ??? ??????? ???? ?????? ??????? ??????. ???? ?? ???? ?????? ??????? ???????? ?????? ??????? ?? ??? ??? ????? ??? ????? ?????. ??? ??? ?????? ????? ?????? ??? ??? ??????? ????? ??? ??????? ?? ???? ?????. ???? ???? ??????? ?????? ??????? ?? ?? ????? ????? ??. ??? ?????? ?? ?????? ??????? ????????? ?????? ??? ?????? ?? ???? ??? ??? ??????? ???????? ??????? ?? ?????? ????? ???? ??????? ????? ????????. ????? ?? ?????? ?????? ??????? ?? ????? ??? ??????? ???????? ???? ??????? ?????? ??? ????? ???? ??? ?????? ??? ??? ??????? ???????? ???? ????????. ?????? ????? ????? ????? ???? ????? ??????? ???????? ???? ?? ??? ??? ?????? (0.5-0.9 ???). ????? ??? ????? ??? ????? ????? ??????? ???????? ???? ???? ????? ?????? ?????? 500-750 ????? ???????. ??? ??? ?????? ??? 1200-1500 ??? ????? ?????? ???? ?? ????? ???? ??????? ??? ????? ?????. ??????? ??? 1500-1800 ??? ????? ?????? ???? ?? ?????? ???? ?????? ??? ????? ?????. ?? ???? ????? ??????? ??? ?????? ??????? ????????? ????? ????? ??????? ?????? ?? ?? ??????? ??????? ?????? ??? ??????? ???????? ???? ????? ?????? ????? ??????. ??????? ??? ??? ??????? ???????? ??????? ???? ???????? ?????? ??? ?? ???: ????? ??? ??????? ???????? ???? ????? ????? ?? ???? ???? ?? ??????. ???? ???? ??? ??? ??????? ??? ????? ??????. ??? ???? ?????? ???? ????? ???????. ????? ?????? ??????. ???? ???? ????? ???? ??????? ???? ??????? ???????? ???????? ??. ?? ???? ?? ????? ?????? ?? ??????? ?????? ?????? ??????? ???????? ?? ???  ?????? ????? ???? ???? ???? ????? ????? ??????. ??? ?????? ????? ????????? ?????? ???????? ????????? ???? ?????? ??? ????????? ??????? ???? ??????? ???????? ?? ???? ??????? ????????. ??? ?? ???? ???? ???????? ???????? ??? ???? ??? ????? ????? ?? ??? ??????? ???????? ???? ??????? ??? ???? ???????? ?? ???? ?????? ?????? ???????. ???? ??? ???? ??? ??? ??????? ???????? ???????? ?? ????? ???????. ??? ???? ????? ???? ?? ??? ????? ?? ??????? ????? ??? ??? ??????? ???????? ??????? ?? ????? ??????? ?? ??? ????? ???? ???? ?????. ??? ???? ??????? ?? ????? ??? ???? ????? ?? ????? ??????? ?? ????? ?????? ??? ????? ??????? ??? 90 ????? ???????. ??? ??? ?????? ????? ?????? ?????? ???? ?? ?????? 90 ????? ???????. ??? ??? ?????? ???????? ?????? ???? ?? ?????? 180 ????? ???????. ??? ??? ????? ??????? ??? ?? ??? ???? ????? ????? ?? ??? ?? ????? ?????? ?? ???? ??? ????: ?? ???? ???????. ?? ??? ????? ?????? ???? ????? ??? ??? ?????? ???????? ?????? ?? ???????? ??? ???????. ?????? ???? ????????. ????? ???? ??? ?????? ?? ?????? ?? ??? ???????. ??? ??????? ???????? ???????? ?? ?? ???? ??????. ?????? ??? ??????? ???????? ?? ??????? ???? ???????. ????? ?????? ??????? ?? ?????? ???? ???? ???????? ????? ????? ????? ?? ???? ?? ??????? ??????? ?? ?????? ?????????? ??? ?????? ????????. ???? ??????? ???? ???? ??????? ???????? ?? ????? ??? ??????? ???????? ???????? ?? ???? ????? ?????? ??? ????. ?? ??? ??????? ????? ?????? ?? ????? ??????? ???? ??? ??????? ???????? ???????? ?? ?? ??? ?? ???????. ???????? ??? ??? ????? ??? ?????? ???? ????? ??????? ?? ?? ????. ?????? ??? ?????? ????? ????? ??????. ????? ???? ????? ???? ??? ????? ?????? ??? ????? ???????. ??? ???? ??????? ?????? ?? ????? ????? ??? ??? ?????????. ??? ??? ????? ???? ??? ????????? ???????? ?? ??????? ??????????? ???????? ??????? ?? ????? ??? ???? ????? ??????? ?? ?? ????. ???? ??? ???? ??????? ?? ????? ?????? ???????? ?? ?????. ???? ????? ???? ????? ???? ??????? ????  ???????? ????? ?? ????? ???? ???? ??? ?????. ?? ?????? ???? ???? ?? ??????? ?? ???? ?? ??? ????? ????? ?????? ???????? ???. ?????? ??????? ???????? ????????? ???? ??? ???? ????? ??? ??? ?????? ?????? ??????. ???? ???????? ??? ??? ?? ????? ?????. ?? ???? ????? ???? ????? ????????? ?? ????? ?????? ?????????. ???? ??? ???? ??????? ????? ??? ????? ?????? ??????? ??????. ???????? ??? ??? ???????? ??? ????? ??????. ???? ????? ???? ???????? ??? ????? ?????? ???? ?????? ???????. ?? ??????? ??????? ?????? ??? ?????? ????? ?????? ????????? ???????? ???? ?? ??? ??????? ???????? ?? ?????? ???????. ?? ???? ??? ????? ???? ??? ????? ??? ??????. ???? ?? ???? ??????? ????????. ???? ?????? ??????? ?????? ????????? ???????? ???????????? ???? ?? ????? ??? ??? ?????? ?? ?????? ??????? ??????? ???????? ?????????. ?????? ???? ???????? ???????? ?? ?? ???? ?? ????? ?????. ???????? ??? ??? ?????? ??????? ?????? ???????. ????? ??? ???????? ???????? ??? ??????? "????? ??????" ?? "??????? ?? ??????". ?????? ?? ??? ??????? ????? ??????? ??? ???? ??????? ???????? ????? ?? ???? ??????? ???????? ????? ?????? ?? ?????? ????. ????? ??? ??? ????? ?????? ?? ????? ??? ??? ???? ?? ??? ?? ??? ?????? ????? ???? ????? ??? ????? ??????. ???? ????? ???????? ??????? ????? ??? ??????? ?????? ??????? ???????? ?????? ???. ????? ???? ??? ????? ?????? ?????? ???? ????. ?????? ????? ??????? ????? ?? ?????. ??????? ?????? ????? ????? ?????. ????? ??????? ????? ????? ???? ?? ??????? ??????????. ???? ?? ???? ??? ???? ???????? ??????????. ??? ??? ?????? ?????????? ????? ??????? ??? ?????? ?????? ?????? ????????. ??? ?????? ????????? ???? ???? ?? ???:   150 ????? ?? ???????? ?????? ????? ??? ?????. 75 ????? ?? ???????? ?????? ??? ?????. ??????? ???? ???? ??? ??????? ???????? ???????? ?? ????????? ???? ???? ???????. ??????? ??? ?? ???? ??????? ???????? ????? ????. ???? ????? ?????? ?? ??????? ??????? ????????. ?? ??????. ???? ????? ??????? ?? ?????  ?????? ??? ?? ???? ????? ????? ??????? ???????? ???. ?? ??????? ???? ??? ?? ?????????  ????? ??????? ???????. ?? ?????? ????? ??????? ???????? ??????? ???????? ????????? ??? ???? ?? ?????????? ????? ?? ????? ???? ?? ????? ???????? ??? ??? ??? ?? ????? ???????. ???? ??? ??????? ???????? ?? ????????? ?????????? ???? ????? ??????? ??? ???? ?????? ????? ???? ????. ??? ????? ????????? ???? ????? ?????? ???????? ????? ???????? ?????????? ??????????? ????? ?????? ???????? ???? ????? ??? ?????? ?? ??????? ??? ?????? ???????. ?????????? ???? ????? ??? ?????? ?? ???????? ?? ???? ????? ??? ???? ?? 5 ?? ?? ??????? ?? ?? ????. ????? ??? ??? ??????? ???????? ???????? ?? ?????????. ????? ?????????? ??? ??????? ?? ????????? ????? ??????? ????????. ???? ?? ???? ??????? ??????? ????? ??????? ??????? ?? ????????? ?????????? ???? ????? ???????. ??? ????? ??????? ??????? ?????? ?????? ?? ?????????. ?? ??????? ???? ??? ?? ???? ????? ??? ????? ??????? ?? ????????? ???? ?? ????? ??? ????? ???? ?? ??????????? ?? ??????? ?? ?????????? ?? ???? ????? ??? ???? ????? ?? ?????? ??? ??????. ????? ??? ???: ????????. ????? ???????? ??????. ??????? ??????? ???????? ?????? ?? ??????? ??????? ?????????? ????????? ???????. ???? ?? ???? ??????? ??????? ????? ??????? ??????? ?? ????????? ?????????? ???? ????? ???? ??????. ??? ????? ??????? ??????? ?????? ?????? ?? ?????????. ??? ?????? ?????? ??????? ???????? ??? ????? ?????? ???? ??????? ????? ??? ????? ???????. ??? ?????? ??????? ?????? ?? ???? ???? ????? ??? ????? ?????? ???????. ??? ??? ??????? ????? ??????? ???: ??? ?? ????? ???? ?? ??? ??? ????? ?? ????? ?????? ????. ??? ???? ???? ?? ????? ????? ????? ????? ???. ??? ????? ?? ????? ???????? ???? ????? ??? ??? ????? ???. ??? ???? ??????? ??? ??????? ??????? ???? ?? ??? ????? ?????? ????? ?? ????? ???????. ????? ??? ?????? ????? ??????????. ?????? ????? ??? ?????? ??? ????? ?? ?????? ???? ?? ?????? ??? ????? ????? ?????? ???. ???  ???? ??????? ???????? ?????? ?? ???????? ????? ??????? ????? ?? ??????? ????????. ???? ??????? ???? ????? ??? ??????? ?????? ????? ????? ??????? ????? ????? ????? ????????? ????? ??????. ???? ??????? ???????? ?? ??????? ?? ??????? ????????. ???? ??????? ??????? ?????? ?? ??????? ??????? ?? ??????? ?? ???? ????? ?? ???? ?????. ???? ?? ???? ??????? ???????? ?????? ??? ??? ??? ????? ????? ??? ???. ???? ????? ?? ?????? ???? ?????? ?? ????? ?????? ??? ?????? ?? ????????? ?????? ??? ???????? ??? ?????? ??????. ??? ??? ??? ????? ??????? ???????? ????? ????? ??? ?? ?????? ???????? ??? ??? ?? ??????? ?? ?????? ???????. ???? ????? ????????? ???????? ?????????? ??? ???? ????????. ???? ??????? ????? ???? ???????? ????????? ??? ????? ???? ????? ?????? ???? ????????. ??? ???? ?? ????? ????? ????? ???? ????? ????? ?????? ?????. ????? ???????? ??? ??? ??????? ?????? ?? ????? ?? ??????? ???????. ???? ????? ???????? ??? ???? ?????? ?? ???? ??????? ??? ??????? ????? ?? ???????? ??????? ???????. ???? ??? ????? ??????? ?? ?? ???????. ??? ??? ????? ????? ????? ?? ???? ??????? ??? ????? ???? ?????? ???? ???????? ?? ???????. ????? ?????? ??? ?????? ?? ?????????: ????? ?????? ??????? ???????? (Centers for Disease Control and Prevention):? [email or web address] ????? ??????? (Department of Agriculture)?: [email or web address] ???? ???? ??????? ??????? ???????? ?????? ??? ??????? ???? ?????? ??????? ??????. ??? ??? ?????? ????? ?????? ??? ??? ??????? ????? ??? ??????? ?? ???? ?????. ??????? ????? ????? ????? ???? ??? ?????? ???????? ???? ??   1 -2 ??? (0.5-0.9 ???). ???? ??? ????? ????? ????? ??????? ???????? ??????? ???? ?????? 500-750 ????? ???????. ???? ?????? ??? ????????? ??????? ???? ??????? ???????? ?? ???? ??????? ????????. ??? ?? ???? ???? ???????? ???????? ??? ???? ??? ????? ????? ?? ??? ??????? ???????? ???? ??????? ??? ???? ???????? ?? ???? ?????? ?????? ???????. ?????? ??????? ???????? ????????? ???? ??? ???? ????? ??? ??? ?????? ??????  ??????. ???? ??? ??????? ???????? ?? ????????? ?????????? ???? ????? ??????? ??? ???? ??????? ??? ??????? ???? ????. ??? ????? ?? ??? ????????? ?? ???? ?????? ????????? ???? ?????? ???? ?????????????. ???? ?? ?????? ??? ????? ???? ?? ???? ?? ???? ??????? ??????.? Document Revised: 04/08/2020 Document Reviewed: 04/08/2020 Elsevier Patient Education  2022 [employer].

## 2021-06-12 NOTE — Assessment & Plan Note (Signed)
Encouraged low carb diet and exercise for weight loss Will trial Saxenda 

## 2021-06-12 NOTE — Assessment & Plan Note (Signed)
Continue Humira per dermatology

## 2021-06-12 NOTE — Assessment & Plan Note (Signed)
Encouraged her to consume a low fat diet CMET and lipid profile today Continue Simvastatin

## 2021-06-12 NOTE — Assessment & Plan Note (Signed)
Uncontrolled Continue Losartan and Amlodipine Will add Furosemide given edema Reinforced DASH diet and exercise for weight loss

## 2021-06-12 NOTE — Assessment & Plan Note (Signed)
Encouraged low carb diet and exercise for weight loss Will trial Saxenda

## 2021-06-12 NOTE — Progress Notes (Signed)
Subjective:    Patient ID: Jasmine Miller, female    DOB: 02/21/1976, 45 y.o.   MRN: [id number]  HPI  Patient presents to clinic today for follow-up of chronic conditions.  HTN: Her BP today is 144/84.  She is taking Losartan and Amlodipine as prescribed.  ECG from 05/2016 reviewed.  Psoriasis: Managed with Humira.  She is following with dermatology.  Anxiety and Depression: Chronic, managed on Fluoxetine, Wellbutrin and Clonazepam.  She is not currently seeing a therapist.  She denies SI/HI.  She is due for CSA today.  Insomnia: She has difficulty falling asleep.  She is taking Trazodone as prescribed with good relief of symptoms.  There is no sleep study on file.  HLD: Her last LDL was 153, triglycerides 342, 02/2021.  She denies myalgias on Simvastatin.  She tries to consume a low-fat diet.  Prediabetes: Her last A1c was 5.9%, 02/2021.  She is not taking any oral diabetic medication at this time.  She does not check her sugars.  Review of Systems     Past Medical History:  Diagnosis Date   Hypertension     Current Outpatient Medications  Medication Sig Dispense Refill   albuterol (VENTOLIN HFA) 108 (90 Base) MCG/ACT inhaler Inhale 2 puffs into the lungs every 6 (six) hours as needed for wheezing or shortness of breath. 8 g 0   amLODipine (NORVASC) 5 MG tablet Take 1 tablet (5 mg total) by mouth daily. 90 tablet 3   buPROPion (WELLBUTRIN XL) 150 MG 24 hr tablet Take 1 tablet (150 mg total) by mouth daily. 90 tablet 3   clonazePAM (KLONOPIN) 0.5 MG tablet TAKE 1 TABLET(0.5 MG) BY MOUTH DAILY AS NEEDED FOR ANXIETY 20 tablet 0   clonazePAM (KLONOPIN) 0.5 MG tablet Take 1 tablet (0.5 mg total) by mouth daily as needed for anxiety. 20 tablet 0   FLUoxetine (PROZAC) 10 MG capsule Take 1 capsule (10 mg total) by mouth daily. 90 capsule 3   HUMIRA PEN 40 MG/0.4ML PNKT Inject 40 mg into the skin every 14 (fourteen) days.     losartan (COZAAR) 100 MG tablet Take 1 tablet (100 mg total)  by mouth daily. 90 tablet 3   simvastatin (ZOCOR) 10 MG tablet TAKE 1 TABLET(10 MG) BY MOUTH AT BEDTIME 30 tablet 0   traZODone (DESYREL) 50 MG tablet Take 1 tablet (50 mg total) by mouth at bedtime. 90 tablet 3   No current facility-administered medications for this visit.    Allergies  Allergen Reactions   Chantix [Varenicline Tartrate] Swelling    Lip swelling and rash   Penicillins Anaphylaxis    Family History  Problem Relation Age of Onset   Breast cancer Maternal Aunt    Breast cancer Maternal Grandmother    Breast cancer Maternal Aunt     Social History   Socioeconomic History   Marital status: Single    Spouse name: Not on file   Number of children: Not on file   Years of education: Not on file   Highest education level: Not on file  Occupational History   Not on file  Tobacco Use   Smoking status: Some Days    Pack years: 0.00    Types: E-cigarettes   Smokeless tobacco: Never   Tobacco comments:    only smokes E-Cigarettes  Substance and Sexual Activity   Alcohol use: Yes   Drug use: No   Sexual activity: Not on file  Other Topics Concern   Not on file  Social History Narrative   Not on file   Social Determinants of Health   Financial Resource Strain: Not on file  Food Insecurity: Not on file  Transportation Needs: Not on file  Physical Activity: Not on file  Stress: Not on file  Social Connections: Not on file  Intimate Partner Violence: Not on file     Constitutional: Pt reports abnormal weight gain. Denies fever, malaise, fatigue, headache.  HEENT: Denies eye pain, eye redness, ear pain, ringing in the ears, wax buildup, runny nose, nasal congestion, bloody nose, or sore throat. Respiratory: Denies difficulty breathing, shortness of breath, cough or sputum production.   Cardiovascular: Denies chest pain, chest tightness, palpitations or swelling in the hands or feet.  Gastrointestinal: Denies abdominal pain, bloating, constipation, diarrhea  or blood in the stool.  GU: Denies urgency, frequency, pain with urination, burning sensation, blood in urine, odor or discharge. Musculoskeletal: Denies decrease in range of motion, difficulty with gait, muscle pain or joint pain and swelling.  Skin: Denies redness, rashes, lesions or ulcercations.  Neurological: Patient reports insomnia.  Denies dizziness, difficulty with memory, difficulty with speech or problems with balance and coordination.  Psych: Patient has a history of anxiety and depression.  Denies SI/HI.  No other specific complaints in a complete review of systems (except as listed in HPI above).  Objective:   Physical Exam  BP (!) 144/84   Pulse 80   Ht 5\' 3"  (1.6 m)   Wt 224 lb 3.2 oz (101.7 kg)   SpO2 100%   BMI 39.72 kg/m   Wt Readings from Last 3 Encounters:  03/13/21 219 lb (99.3 kg)  02/08/20 219 lb (99.3 kg)  01/12/20 218 lb (98.9 kg)    General: Appears her stated age, obese, in NAD. Skin: Warm, dry and intact. No rashes noted. HEENT: Head: normal shape and size; Eyes: sclera white and EOMs intact;  Neck:  Neck supple, trachea midline. No masses, lumps or thyromegaly present.  Cardiovascular: Normal rate and rhythm. S1,S2 noted.  No murmur, rubs or gallops noted. 1+ BLE edema.  Pulmonary/Chest: Normal effort and positive vesicular breath sounds. No respiratory distress. No wheezes, rales or ronchi noted.  Musculoskeletal: No difficulty with gait.  Neurological: Alert and oriented.  Psychiatric: Mood and affect normal. Behavior is normal. Judgment and thought content normal.    BMET    Component Value Date/Time   NA 138 03/13/2021 1036   NA 142 04/06/2019 1554   NA 137 07/17/2014 1237   K 3.7 03/13/2021 1036   K 3.6 07/17/2014 1237   CL 103 03/13/2021 1036   CL 105 07/17/2014 1237   CO2 27 03/13/2021 1036   CO2 28 07/17/2014 1237   GLUCOSE 82 03/13/2021 1036   GLUCOSE 103 (H) 07/17/2014 1237   BUN 17 03/13/2021 1036   BUN 13 04/06/2019 1554    BUN 8 07/17/2014 1237   CREATININE 0.61 03/13/2021 1036   CREATININE 0.72 07/17/2014 1237   CALCIUM 9.1 03/13/2021 1036   CALCIUM 8.5 07/17/2014 1237   GFRNONAA 108 04/06/2019 1554   GFRNONAA >60 07/17/2014 1237   GFRAA 125 04/06/2019 1554   GFRAA >60 07/17/2014 1237    Lipid Panel     Component Value Date/Time   CHOL 249 (H) 03/13/2021 1036   TRIG 342.0 (H) 03/13/2021 1036   HDL 47.50 03/13/2021 1036   CHOLHDL 5 03/13/2021 1036   VLDL 68.4 (H) 03/13/2021 1036    CBC    Component Value Date/Time  WBC 6.0 03/13/2021 1036   RBC 4.54 03/13/2021 1036   HGB 12.9 03/13/2021 1036   HGB 13.5 07/17/2014 1237   HCT 37.8 03/13/2021 1036   HCT 40.2 07/17/2014 1237   PLT 195.0 03/13/2021 1036   PLT 204 07/17/2014 1237   MCV 83.3 03/13/2021 1036   MCV 89 07/17/2014 1237   MCH 29.2 06/17/2016 1046   MCHC 34.1 03/13/2021 1036   RDW 13.0 03/13/2021 1036   RDW 13.3 07/17/2014 1237    Hgb A1C Lab Results  Component Value Date   HGBA1C 5.9 03/13/2021           Assessment & Plan:     Nicki Reaper, NP This visit occurred during the SARS-CoV-2 public health emergency.  Safety protocols were in place, including screening questions prior to the visit, additional usage of staff PPE, and extensive cleaning of exam room while observing appropriate contact time as indicated for disinfecting solutions.

## 2021-06-19 ENCOUNTER — Encounter: Payer: Self-pay | Admitting: Internal Medicine

## 2021-06-23 NOTE — Telephone Encounter (Signed)
Is there a prior Auth that can be done on this?

## 2021-07-07 ENCOUNTER — Other Ambulatory Visit: Payer: Self-pay | Admitting: Internal Medicine

## 2021-07-07 DIAGNOSIS — E7800 Pure hypercholesterolemia, unspecified: Secondary | ICD-10-CM

## 2021-07-09 ENCOUNTER — Other Ambulatory Visit: Payer: Self-pay

## 2021-07-11 ENCOUNTER — Telehealth: Payer: Self-pay

## 2021-07-11 NOTE — Telephone Encounter (Signed)
I called and spoke with the [employer] claim services([employer]) for the patient insurance company after receiving a letter stating the Saxenda PA was cancelled due to it being a covered medication on the patient list. The pharmacist informed me that the PA was cancelled because this medication is on her exclusion list which means that it will not be covered by her health plan for any reason. I ask for alternatives and was told that no alternatives was listed. I then verified if Wegovy, phentermine and contrave was approved and they are also on the exclusion list.

## 2021-07-11 NOTE — Telephone Encounter (Signed)
Just let patient know her insurance will not cover any weight loss medications

## 2021-07-11 NOTE — Telephone Encounter (Addendum)
Patient returned call and informed of PCP message

## 2021-08-08 ENCOUNTER — Encounter: Payer: Self-pay | Admitting: Internal Medicine

## 2021-08-08 DIAGNOSIS — E7800 Pure hypercholesterolemia, unspecified: Secondary | ICD-10-CM

## 2021-08-08 DIAGNOSIS — I10 Essential (primary) hypertension: Secondary | ICD-10-CM

## 2021-08-11 MED ORDER — TRAZODONE HCL 50 MG PO TABS: 50.0000 mg | ORAL_TABLET | Freq: Every day | ORAL | 11 refills | Status: DC

## 2021-08-11 MED ORDER — FUROSEMIDE 20 MG PO TABS: 20.0000 mg | ORAL_TABLET | Freq: Every day | ORAL | 11 refills | Status: DC

## 2021-08-11 MED ORDER — BUPROPION HCL ER (XL) 150 MG PO TB24: 150.0000 mg | ORAL_TABLET | Freq: Every day | ORAL | 11 refills | Status: DC

## 2021-08-11 MED ORDER — AMLODIPINE BESYLATE 5 MG PO TABS: 5.0000 mg | ORAL_TABLET | Freq: Every day | ORAL | 11 refills | Status: DC

## 2021-08-11 MED ORDER — LOSARTAN POTASSIUM 100 MG PO TABS: 100.0000 mg | ORAL_TABLET | Freq: Every day | ORAL | 11 refills | Status: DC

## 2021-08-11 MED ORDER — FLUOXETINE HCL 10 MG PO CAPS: 10.0000 mg | ORAL_CAPSULE | Freq: Every day | ORAL | 11 refills | Status: DC

## 2021-08-11 MED ORDER — SIMVASTATIN 10 MG PO TABS: 10.0000 mg | ORAL_TABLET | Freq: Every day | ORAL | 11 refills | Status: DC

## 2022-01-08 ENCOUNTER — Ambulatory Visit: Payer: 59 | Admitting: Internal Medicine

## 2022-01-08 NOTE — Progress Notes (Deleted)
Subjective:    Patient ID: Jasmine Miller, female    DOB: 1976/11/03, 46 y.o.   MRN: [id number]  HPI  Pt presents to the clinic today with c/o night sweats and insomnia. She does take Trazadone for sleep.  Review of Systems  Past Medical History:  Diagnosis Date   Hypertension     Current Outpatient Medications  Medication Sig Dispense Refill   albuterol (VENTOLIN HFA) 108 (90 Base) MCG/ACT inhaler Inhale 2 puffs into the lungs every 6 (six) hours as needed for wheezing or shortness of breath. 8 g 0   amLODipine (NORVASC) 5 MG tablet Take 1 tablet (5 mg total) by mouth daily. 30 tablet 11   buPROPion (WELLBUTRIN XL) 150 MG 24 hr tablet Take 1 tablet (150 mg total) by mouth daily. 30 tablet 11   clonazePAM (KLONOPIN) 0.5 MG tablet Take 1 tablet (0.5 mg total) by mouth daily as needed for anxiety. 20 tablet 0   FLUoxetine (PROZAC) 10 MG capsule Take 1 capsule (10 mg total) by mouth daily. 30 capsule 11   furosemide (LASIX) 20 MG tablet Take 1 tablet (20 mg total) by mouth daily. 30 tablet 11   HUMIRA PEN 40 MG/0.4ML PNKT Inject 40 mg into the skin every 14 (fourteen) days.     losartan (COZAAR) 100 MG tablet Take 1 tablet (100 mg total) by mouth daily. 30 tablet 11   simvastatin (ZOCOR) 10 MG tablet Take 1 tablet (10 mg total) by mouth daily at 6 PM. 30 tablet 11   traZODone (DESYREL) 50 MG tablet Take 1 tablet (50 mg total) by mouth at bedtime. 30 tablet 11   No current facility-administered medications for this visit.    Allergies  Allergen Reactions   Chantix [Varenicline Tartrate] Swelling    Lip swelling and rash   Penicillins Anaphylaxis    Family History  Problem Relation Age of Onset   Breast cancer Maternal Aunt    Breast cancer Maternal Grandmother    Breast cancer Maternal Aunt     Social History   Socioeconomic History   Marital status: Single    Spouse name: Not on file   Number of children: Not on file   Years of education: Not on file   Highest  education level: Not on file  Occupational History   Not on file  Tobacco Use   Smoking status: Some Days    Types: E-cigarettes   Smokeless tobacco: Never   Tobacco comments:    only smokes E-Cigarettes  Substance and Sexual Activity   Alcohol use: Yes   Drug use: No   Sexual activity: Not on file  Other Topics Concern   Not on file  Social History Narrative   Not on file   Social Determinants of Health   Financial Resource Strain: Not on file  Food Insecurity: Not on file  Transportation Needs: Not on file  Physical Activity: Not on file  Stress: Not on file  Social Connections: Not on file  Intimate Partner Violence: Not on file     Constitutional: Denies fever, malaise, fatigue, headache or abrupt weight changes.  HEENT: Denies eye pain, eye redness, ear pain, ringing in the ears, wax buildup, runny nose, nasal congestion, bloody nose, or sore throat. Respiratory: Denies difficulty breathing, shortness of breath, cough or sputum production.   Cardiovascular: Denies chest pain, chest tightness, palpitations or swelling in the hands or feet.  Gastrointestinal: Denies abdominal pain, bloating, constipation, diarrhea or blood in the stool.  GU:  Denies urgency, frequency, pain with urination, burning sensation, blood in urine, odor or discharge. Musculoskeletal: Denies decrease in range of motion, difficulty with gait, muscle pain or joint pain and swelling.  Skin: Denies redness, rashes, lesions or ulcercations.  Neurological: Pt reports night sweats and insomnia. Denies dizziness, difficulty with memory, difficulty with speech or problems with balance and coordination.  Psych: Denies anxiety, depression, SI/HI.  No other specific complaints in a complete review of systems (except as listed in HPI above).     Objective:   Physical Exam  There were no vitals taken for this visit. Wt Readings from Last 3 Encounters:  06/12/21 224 lb 3.2 oz (101.7 kg)  03/13/21 219 lb  (99.3 kg)  02/08/20 219 lb (99.3 kg)    General: Appears their stated age, well developed, well nourished in NAD. Skin: Warm, dry and intact. No rashes, lesions or ulcerations noted. HEENT: Head: normal shape and size; Eyes: sclera white, no icterus, conjunctiva pink, PERRLA and EOMs intact; Ears: Tm's gray and intact, normal light reflex; Nose: mucosa pink and moist, septum midline; Throat/Mouth: Teeth present, mucosa pink and moist, no exudate, lesions or ulcerations noted.  Neck:  Neck supple, trachea midline. No masses, lumps or thyromegaly present.  Cardiovascular: Normal rate and rhythm. S1,S2 noted.  No murmur, rubs or gallops noted. No JVD or BLE edema. No carotid bruits noted. Pulmonary/Chest: Normal effort and positive vesicular breath sounds. No respiratory distress. No wheezes, rales or ronchi noted.  Abdomen: Soft and nontender. Normal bowel sounds. No distention or masses noted. Liver, spleen and kidneys non palpable. Musculoskeletal: Normal range of motion. No signs of joint swelling. No difficulty with gait.  Neurological: Alert and oriented. Cranial nerves II-XII grossly intact. Coordination normal.  Psychiatric: Mood and affect normal. Behavior is normal. Judgment and thought content normal.    BMET    Component Value Date/Time   NA 138 03/13/2021 1036   NA 142 04/06/2019 1554   NA 137 07/17/2014 1237   K 3.7 03/13/2021 1036   K 3.6 07/17/2014 1237   CL 103 03/13/2021 1036   CL 105 07/17/2014 1237   CO2 27 03/13/2021 1036   CO2 28 07/17/2014 1237   GLUCOSE 82 03/13/2021 1036   GLUCOSE 103 (H) 07/17/2014 1237   BUN 17 03/13/2021 1036   BUN 13 04/06/2019 1554   BUN 8 07/17/2014 1237   CREATININE 0.61 03/13/2021 1036   CREATININE 0.72 07/17/2014 1237   CALCIUM 9.1 03/13/2021 1036   CALCIUM 8.5 07/17/2014 1237   GFRNONAA 108 04/06/2019 1554   GFRNONAA >60 07/17/2014 1237   GFRAA 125 04/06/2019 1554   GFRAA >60 07/17/2014 1237    Lipid Panel     Component  Value Date/Time   CHOL 249 (H) 03/13/2021 1036   TRIG 342.0 (H) 03/13/2021 1036   HDL 47.50 03/13/2021 1036   CHOLHDL 5 03/13/2021 1036   VLDL 68.4 (H) 03/13/2021 1036    CBC    Component Value Date/Time   WBC 6.0 03/13/2021 1036   RBC 4.54 03/13/2021 1036   HGB 12.9 03/13/2021 1036   HGB 13.5 07/17/2014 1237   HCT 37.8 03/13/2021 1036   HCT 40.2 07/17/2014 1237   PLT 195.0 03/13/2021 1036   PLT 204 07/17/2014 1237   MCV 83.3 03/13/2021 1036   MCV 89 07/17/2014 1237   MCH 29.2 06/17/2016 1046   MCHC 34.1 03/13/2021 1036   RDW 13.0 03/13/2021 1036   RDW 13.3 07/17/2014 1237    Hgb A1C  Lab Results  Component Value Date   HGBA1C 5.9 03/13/2021            Assessment & Plan:     Nicki Reaper, NP This visit occurred during the SARS-CoV-2 public health emergency.  Safety protocols were in place, including screening questions prior to the visit, additional usage of staff PPE, and extensive cleaning of exam room while observing appropriate contact time as indicated for disinfecting solutions.

## 2022-02-11 ENCOUNTER — Other Ambulatory Visit: Payer: Self-pay | Admitting: Internal Medicine

## 2022-02-11 MED ORDER — CLONAZEPAM 0.5 MG PO TABS: 0.5000 mg | ORAL_TABLET | Freq: Every day | ORAL | 0 refills | Status: DC | PRN

## 2022-02-12 ENCOUNTER — Ambulatory Visit: Payer: BLUE CROSS/BLUE SHIELD | Admitting: Internal Medicine

## 2022-02-12 ENCOUNTER — Other Ambulatory Visit: Payer: Self-pay

## 2022-02-12 ENCOUNTER — Encounter: Payer: Self-pay | Admitting: Internal Medicine

## 2022-02-12 VITALS — BP 136/88 | HR 94 | Temp 97.8°F | Wt 224.0 lb

## 2022-02-12 DIAGNOSIS — Z6839 Body mass index (BMI) 39.0-39.9, adult: Secondary | ICD-10-CM

## 2022-02-12 DIAGNOSIS — R102 Pelvic and perineal pain: Secondary | ICD-10-CM

## 2022-02-12 DIAGNOSIS — G8929 Other chronic pain: Secondary | ICD-10-CM

## 2022-02-12 DIAGNOSIS — L680 Hirsutism: Secondary | ICD-10-CM

## 2022-02-12 DIAGNOSIS — N83202 Unspecified ovarian cyst, left side: Secondary | ICD-10-CM

## 2022-02-12 DIAGNOSIS — R4586 Emotional lability: Secondary | ICD-10-CM

## 2022-02-12 DIAGNOSIS — G5600 Carpal tunnel syndrome, unspecified upper limb: Secondary | ICD-10-CM | POA: Insufficient documentation

## 2022-02-12 DIAGNOSIS — S80861A Insect bite (nonvenomous), right lower leg, initial encounter: Secondary | ICD-10-CM | POA: Diagnosis not present

## 2022-02-12 DIAGNOSIS — F419 Anxiety disorder, unspecified: Secondary | ICD-10-CM | POA: Diagnosis not present

## 2022-02-12 DIAGNOSIS — G5603 Carpal tunnel syndrome, bilateral upper limbs: Secondary | ICD-10-CM

## 2022-02-12 DIAGNOSIS — E782 Mixed hyperlipidemia: Secondary | ICD-10-CM

## 2022-02-12 DIAGNOSIS — N838 Other noninflammatory disorders of ovary, fallopian tube and broad ligament: Secondary | ICD-10-CM

## 2022-02-12 DIAGNOSIS — Z1211 Encounter for screening for malignant neoplasm of colon: Secondary | ICD-10-CM | POA: Diagnosis not present

## 2022-02-12 DIAGNOSIS — R61 Generalized hyperhidrosis: Secondary | ICD-10-CM

## 2022-02-12 DIAGNOSIS — L089 Local infection of the skin and subcutaneous tissue, unspecified: Secondary | ICD-10-CM

## 2022-02-12 DIAGNOSIS — F32A Depression, unspecified: Secondary | ICD-10-CM

## 2022-02-12 DIAGNOSIS — R7303 Prediabetes: Secondary | ICD-10-CM

## 2022-02-12 DIAGNOSIS — W57XXXA Bitten or stung by nonvenomous insect and other nonvenomous arthropods, initial encounter: Secondary | ICD-10-CM

## 2022-02-12 DIAGNOSIS — N951 Menopausal and female climacteric states: Secondary | ICD-10-CM | POA: Insufficient documentation

## 2022-02-12 MED ORDER — DOXYCYCLINE HYCLATE 100 MG PO TABS
100.0000 mg | ORAL_TABLET | Freq: Two times a day (BID) | ORAL | 0 refills | Status: DC
Start: 2022-02-12 — End: 2022-10-13

## 2022-02-12 NOTE — Assessment & Plan Note (Signed)
A1C today. 

## 2022-02-12 NOTE — Assessment & Plan Note (Signed)
Encouraged diet and exercise for weight loss ?

## 2022-02-12 NOTE — Assessment & Plan Note (Signed)
CMET and lipid profile today 

## 2022-02-12 NOTE — Assessment & Plan Note (Signed)
Advised her to wear cock up splints and take antiinflammatories OTC as needed Discussed further workup with EMG testing and referral to hand surgery for surgical consideration, but she would like to hold off at this time

## 2022-02-12 NOTE — Patient Instructions (Signed)
Pelvic Pain, Female °Pelvic pain is pain in your lower belly (abdomen), below your belly button and between your hips. The pain may: °Start all of a sudden (be acute). °Keep coming back (be recurring). °Last a long time (become chronic). Pelvic pain that lasts longer than 6 months is called chronic pelvic pain. °There are many causes of pelvic pain. Sometimes the cause of pelvic pain is not known. °Follow these instructions at home: ° °Take over-the-counter and prescription medicines only as told by your doctor. °Rest as told by your doctor. °Do not have sex if it hurts. °Keep a journal of your pelvic pain. Write down: °When the pain started. °Where the pain is located. °What seems to make the pain better or worse, such as food or your monthly period (menstrual cycle). °Any symptoms you have along with the pain. °Keep all follow-up visits. °Contact a doctor if: °Medicine does not help your pain, or your pain comes back. °You have new symptoms. °You have unusual discharge or bleeding from your vagina. °You have a fever or chills. °You are having trouble pooping (constipation). °You have blood in your pee (urine) or poop (stool). °Your pee smells bad. °You feel weak or light-headed. °Get help right away if: °You have sudden pain that is very bad. °You have very bad pain and also have any of these symptoms: °A fever. °Feeling like you may vomit (nauseous). °Vomiting. °Being very sweaty. °You faint. °These symptoms may be an emergency. Get help right away. Call your local emergency services (911 in the [place] °Do not wait to see if the symptoms will go away. °Do not drive yourself to the hospital. °Summary °Pelvic pain is pain in your lower belly (abdomen), below your belly button and between your hips. °There are many causes of pelvic pain. °Keep a journal of your pelvic pain. °This information is not intended to replace advice given to you by your health care provider. Make sure you discuss any questions you have with  your health care provider. °Document Revised: 04/22/2021 Document Reviewed: 04/22/2021 °Elsevier Patient Education © 2022 [employer] ° °

## 2022-02-12 NOTE — Progress Notes (Signed)
Subjective:    Patient ID: Jasmine Miller, female    DOB: 06-20-1976, 46 y.o.   MRN: [id number]  HPI  Pt presents to the clinic today with c/o difficulty losing weight, insomnia, hot flashes, and night sweats. She noticed this 1 year ago but worsened in the last 6 months. She reports the hot flashes and night sweats cause nausea. She reports she can fall asleep easily with the Trazadone but she can not stay asleep. Her FSH/LH in 2021 showed that she was not menopausal. She has had a partial hysterectomy.  She also reports a bug bite to her right lower leg. This occurred 2 days ago. She reports yesterday it became more red, swollen and painful. She has not noticed any drainage from the area. She denies fever, chills, nausea or vomiting. She has not tried anything OTC for this.   She also reports bilateral hand pain and numbness. She noticed this about 6 months ago.  She describes the pain as an electric shock. The left seems worse than the right. The numbness can wake her up at night.   She also reports chronic pelvic pain. She describes it as a sensation that her internal organs are going to fall out if she stands for too long. She has a pelvic ultrasound in 2020 which showed:  IMPRESSION: 2.5 cm complex cystic lesion in left ovary, which has indeterminate but probably benign characteristics. Differential diagnosis includes functional ovarian cyst and cystic ovarian neoplasm. Recommend continued follow-up by ultrasound in 6-12 weeks.  She would also like a referral for her colonoscopy.  Review of Systems     Past Medical History:  Diagnosis Date   Hypertension     Current Outpatient Medications  Medication Sig Dispense Refill   albuterol (VENTOLIN HFA) 108 (90 Base) MCG/ACT inhaler Inhale 2 puffs into the lungs every 6 (six) hours as needed for wheezing or shortness of breath. 8 g 0   amLODipine (NORVASC) 5 MG tablet Take 1 tablet (5 mg total) by mouth daily. 30 tablet 11    buPROPion (WELLBUTRIN XL) 150 MG 24 hr tablet Take 1 tablet (150 mg total) by mouth daily. 30 tablet 11   clonazePAM (KLONOPIN) 0.5 MG tablet Take 1 tablet (0.5 mg total) by mouth daily as needed for anxiety. 20 tablet 0   FLUoxetine (PROZAC) 10 MG capsule Take 1 capsule (10 mg total) by mouth daily. 30 capsule 11   furosemide (LASIX) 20 MG tablet Take 1 tablet (20 mg total) by mouth daily. 30 tablet 11   HUMIRA PEN 40 MG/0.4ML PNKT Inject 40 mg into the skin every 14 (fourteen) days.     losartan (COZAAR) 100 MG tablet Take 1 tablet (100 mg total) by mouth daily. 30 tablet 11   simvastatin (ZOCOR) 10 MG tablet Take 1 tablet (10 mg total) by mouth daily at 6 PM. 30 tablet 11   traZODone (DESYREL) 50 MG tablet Take 1 tablet (50 mg total) by mouth at bedtime. 30 tablet 11   No current facility-administered medications for this visit.    Allergies  Allergen Reactions   Chantix [Varenicline Tartrate] Swelling    Lip swelling and rash   Penicillins Anaphylaxis    Family History  Problem Relation Age of Onset   Breast cancer Maternal Aunt    Breast cancer Maternal Grandmother    Breast cancer Maternal Aunt     Social History   Socioeconomic History   Marital status: Single    Spouse name: Not on  file   Number of children: Not on file   Years of education: Not on file   Highest education level: Not on file  Occupational History   Not on file  Tobacco Use   Smoking status: Some Days    Types: E-cigarettes   Smokeless tobacco: Never   Tobacco comments:    only smokes E-Cigarettes  Substance and Sexual Activity   Alcohol use: Yes   Drug use: No   Sexual activity: Not on file  Other Topics Concern   Not on file  Social History Narrative   Not on file   Social Determinants of Health   Financial Resource Strain: Not on file  Food Insecurity: Not on file  Transportation Needs: Not on file  Physical Activity: Not on file  Stress: Not on file  Social Connections: Not on file   Intimate Partner Violence: Not on file     Constitutional: Pt reports difficulty losing weight. Denies fever, malaise, fatigue, headache.  Respiratory: Denies difficulty breathing, shortness of breath, cough or sputum production.   Cardiovascular: Denies chest pain, chest tightness, palpitations or swelling in the hands or feet.  Gastrointestinal: Pt reports chronic pelvic pain. Denies bloating, constipation, diarrhea or blood in the stool.  GU: Denies urgency, frequency, pain with urination, burning sensation, blood in urine, odor or discharge. Musculoskeletal: Denies decrease in range of motion, difficulty with gait, muscle pain or joint pain and swelling.  Skin: Pt reports insect bite to right lower leg. Denies rashes, or ulcercations.  Neurological: Pt reports paresthesia and pain of bilateral hands, insomnia, hot flashes, night sweats. Denies dizziness, difficulty with memory, difficulty with speech or problems with balance and coordination.    No other specific complaints in a complete review of systems (except as listed in HPI above).  Objective:   Physical Exam BP 136/88 (BP Location: Right Arm, Patient Position: Sitting, Cuff Size: Large)    Pulse 94    Temp 97.8 F (36.6 C) (Temporal)    Wt 224 lb (101.6 kg)    SpO2 96%    BMI 39.68 kg/m   Wt Readings from Last 3 Encounters:  06/12/21 224 lb 3.2 oz (101.7 kg)  03/13/21 219 lb (99.3 kg)  02/08/20 219 lb (99.3 kg)    General: Appears her stated age, obese, in NAD. Skin: Warm, dry and intact. Excessive hair noted on arms. Infected insect bite with mild surrounding cellulitis noted of RLE. HEENT: Head: normal shape and size; Eyes: EOMs intact;  Cardiovascular: Normal rate and rhythm. S1,S2 noted.  No murmur, rubs or gallops noted.  Pulmonary/Chest: Normal effort and positive vesicular breath sounds. No respiratory distress. No wheezes, rales or ronchi noted.  Abdomen: Normal bowel sounds.  Musculoskeletal: Normal flexion,  extension and rotation of the wrist. No joint swelling noted.  No difficulty with gait.  Neurological: Alert and oriented. Negative Tinel's, positive Phalen's.    BMET    Component Value Date/Time   NA 138 03/13/2021 1036   NA 142 04/06/2019 1554   NA 137 07/17/2014 1237   K 3.7 03/13/2021 1036   K 3.6 07/17/2014 1237   CL 103 03/13/2021 1036   CL 105 07/17/2014 1237   CO2 27 03/13/2021 1036   CO2 28 07/17/2014 1237   GLUCOSE 82 03/13/2021 1036   GLUCOSE 103 (H) 07/17/2014 1237   BUN 17 03/13/2021 1036   BUN 13 04/06/2019 1554   BUN 8 07/17/2014 1237   CREATININE 0.61 03/13/2021 1036   CREATININE 0.72 07/17/2014 1237  CALCIUM 9.1 03/13/2021 1036   CALCIUM 8.5 07/17/2014 1237   GFRNONAA 108 04/06/2019 1554   GFRNONAA >60 07/17/2014 1237   GFRAA 125 04/06/2019 1554   GFRAA >60 07/17/2014 1237    Lipid Panel     Component Value Date/Time   CHOL 249 (H) 03/13/2021 1036   TRIG 342.0 (H) 03/13/2021 1036   HDL 47.50 03/13/2021 1036   CHOLHDL 5 03/13/2021 1036   VLDL 68.4 (H) 03/13/2021 1036    CBC    Component Value Date/Time   WBC 6.0 03/13/2021 1036   RBC 4.54 03/13/2021 1036   HGB 12.9 03/13/2021 1036   HGB 13.5 07/17/2014 1237   HCT 37.8 03/13/2021 1036   HCT 40.2 07/17/2014 1237   PLT 195.0 03/13/2021 1036   PLT 204 07/17/2014 1237   MCV 83.3 03/13/2021 1036   MCV 89 07/17/2014 1237   MCH 29.2 06/17/2016 1046   MCHC 34.1 03/13/2021 1036   RDW 13.0 03/13/2021 1036   RDW 13.3 07/17/2014 1237    Hgb A1C Lab Results  Component Value Date   HGBA1C 5.9 03/13/2021          Assessment & Plan:   Infected Insect Bite of RLE:  Try Hydrocortisone cream BID prn  RX for Doxycyline 100 mg BID x 7 days- avoid sun exposure  Chronic Pelvic Pain, Left Ovarian Cyst:  US pelvis/transvaginal ordered  Screen for Colon Cancer:  Referral to GI for screening colonoscopy  RTC in 2 months for your annual exam  Nicki Reaper, NP This visit occurred during the  SARS-CoV-2 public health emergency.  Safety protocols were in place, including screening questions prior to the visit, additional usage of staff PPE, and extensive cleaning of exam room while observing appropriate contact time as indicated for disinfecting solutions.

## 2022-02-12 NOTE — Assessment & Plan Note (Signed)
Will check TSH, FSH/LH, Testosterone, Progesterone Consider Estroven vs HRT pending labs

## 2022-02-13 MED ORDER — ATORVASTATIN CALCIUM 10 MG PO TABS: 10.0000 mg | ORAL_TABLET | Freq: Every day | ORAL | 2 refills | Status: DC

## 2022-02-16 ENCOUNTER — Ambulatory Visit: Admission: RE | Admit: 2022-02-16 | Payer: BLUE CROSS/BLUE SHIELD | Source: Ambulatory Visit

## 2022-02-16 ENCOUNTER — Telehealth: Payer: Self-pay

## 2022-02-16 MED ORDER — LEVOFLOXACIN 500 MG PO TABS: 500.0000 mg | ORAL_TABLET | Freq: Every day | ORAL | 0 refills | Status: DC

## 2022-02-16 NOTE — Addendum Note (Signed)
Addended by: Lorre Munroe on: 02/16/2022 12:59 PM   Modules accepted: Orders

## 2022-02-16 NOTE — Telephone Encounter (Signed)
CALLED PATIENT NO ANSWER LEFT VOICEMAIL FOR A CALL BACK LETTER SENT 

## 2022-02-16 NOTE — Addendum Note (Signed)
Addended by: Lorre Munroe on: 02/16/2022 01:44 PM   Modules accepted: Orders

## 2022-02-17 NOTE — Telephone Encounter (Signed)
I have ordered this ultrasound twice. The first time I ordered, she was referred somewhere that did not take her insurance. I cancelled that order and placed a new one. They scheduled her for the same place, she was scheduled for the first time. Can we get her somewhere that takes her insurance?

## 2022-02-19 MED ORDER — PHENTERMINE HCL 37.5 MG PO CAPS
37.5000 mg | ORAL_CAPSULE | ORAL | 0 refills | Status: DC
Start: 2022-02-19 — End: 2022-03-02

## 2022-02-19 NOTE — Addendum Note (Signed)
Addended by: Jearld Fenton on: 02/19/2022 12:55 PM   Modules accepted: Orders

## 2022-02-23 ENCOUNTER — Ambulatory Visit: Admission: RE | Admit: 2022-02-23 | Payer: BLUE CROSS/BLUE SHIELD | Source: Ambulatory Visit

## 2022-02-24 LAB — COMPLETE METABOLIC PANEL WITH GFR
AG Ratio: 1.3 (calc) (ref 1.0–2.5)
ALT: 17 U/L (ref 6–29)
AST: 15 U/L (ref 10–35)
Albumin: 4 g/dL (ref 3.6–5.1)
Alkaline phosphatase (APISO): 59 U/L (ref 31–125)
BUN: 14 mg/dL (ref 7–25)
CO2: 27 mmol/L (ref 20–32)
Calcium: 9.2 mg/dL (ref 8.6–10.2)
Chloride: 104 mmol/L (ref 98–110)
Creat: 0.69 mg/dL (ref 0.50–0.99)
Globulin: 3.2 g/dL (calc) (ref 1.9–3.7)
Glucose, Bld: 80 mg/dL (ref 65–139)
Potassium: 3.7 mmol/L (ref 3.5–5.3)
Sodium: 140 mmol/L (ref 135–146)
Total Bilirubin: 0.3 mg/dL (ref 0.2–1.2)
Total Protein: 7.2 g/dL (ref 6.1–8.1)
eGFR: 109 mL/min/{1.73_m2} (ref >=60)

## 2022-02-24 LAB — FSH/LH
FSH: 59.1 m[IU]/mL
LH: 14.7 m[IU]/mL

## 2022-02-24 LAB — CBC
HCT: 36.9 % (ref 35.0–45.0)
Hemoglobin: 12.6 g/dL (ref 11.7–15.5)
MCH: 28.8 pg (ref 27.0–33.0)
MCHC: 34.1 g/dL (ref 32.0–36.0)
MCV: 84.4 fL (ref 80.0–100.0)
MPV: 11.7 fL (ref 7.5–12.5)
Platelets: 193 10*3/uL (ref 140–400)
RBC: 4.37 10*6/uL (ref 3.80–5.10)
RDW: 12.6 % (ref 11.0–15.0)
WBC: 8 10*3/uL (ref 3.8–10.8)

## 2022-02-24 LAB — TEST AUTHORIZATION: TEST CODE:: 7173

## 2022-02-24 LAB — LIPID PANEL
Cholesterol: 246 mg/dL — ABNORMAL HIGH (ref <200)
HDL: 45 mg/dL — ABNORMAL LOW (ref >=50)
LDL Cholesterol (Calc): 142 mg/dL (calc) — ABNORMAL HIGH
Non-HDL Cholesterol (Calc): 201 mg/dL (calc) — ABNORMAL HIGH (ref <130)
Total CHOL/HDL Ratio: 5.5 (calc) — ABNORMAL HIGH (ref <5.0)
Triglycerides: 383 mg/dL — ABNORMAL HIGH (ref <150)

## 2022-02-24 LAB — ESTROGENS, TOTAL: Estrogen: 150.9 pg/mL

## 2022-02-24 LAB — HEMOGLOBIN A1C
Hgb A1c MFr Bld: 5.9 % of total Hgb — ABNORMAL HIGH (ref <5.7)
Mean Plasma Glucose: 123 mg/dL
eAG (mmol/L): 6.8 mmol/L

## 2022-02-24 LAB — TSH: TSH: 1.47 mIU/L

## 2022-02-24 LAB — TESTOSTERONE, TOTAL, LC/MS/MS: Testosterone, Total, LC-MS-MS: 15 ng/dL (ref 2–45)

## 2022-03-02 MED ORDER — PHENTERMINE HCL 15 MG PO CAPS
15.0000 mg | ORAL_CAPSULE | ORAL | 0 refills | Status: DC
Start: 2022-03-02 — End: 2022-10-13

## 2022-03-02 NOTE — Addendum Note (Signed)
Addended by: Jearld Fenton on: 03/02/2022 01:49 PM ? ? Modules accepted: Orders ? ?

## 2022-03-04 ENCOUNTER — Other Ambulatory Visit: Payer: Self-pay | Admitting: Internal Medicine

## 2022-03-04 NOTE — Telephone Encounter (Signed)
prozac discontinued 02/19/22, wellbutrin discontinued 02/12/22. ?Requested Prescriptions  ?Signed Prescriptions Disp Refills  ? amLODipine (NORVASC) 5 MG tablet 90 tablet 0  ?  Sig: TAKE 1 TABLET(5 MG) BY MOUTH DAILY  ?  ? Cardiovascular: Calcium Channel Blockers 2 Passed - 03/04/2022  4:33 PM  ?  ?  Passed - Last BP in normal range  ?  BP Readings from Last 1 Encounters:  ?02/12/22 136/88  ?   ?  ?  Passed - Last Heart Rate in normal range  ?  Pulse Readings from Last 1 Encounters:  ?02/12/22 94  ?   ?  ?  Passed - Valid encounter within last 6 months  ?  Recent Outpatient Visits   ?      ? 2 weeks ago Cyst of left ovary  ? [facility] [place], [place] W, NP  ? 8 months ago Psoriasis  ? [facility] [place], [place] W, NP  ?  ?  ? ?  ?  ?  ? traZODone (DESYREL) 50 MG tablet 90 tablet 0  ?  Sig: TAKE 1 TABLET(50 MG) BY MOUTH AT BEDTIME  ?  ? Psychiatry: Antidepressants - Serotonin Modulator Passed - 03/04/2022  4:33 PM  ?  ?  Passed - Completed PHQ-2 or PHQ-9 in the last 360 days  ?  ?  Passed - Valid encounter within last 6 months  ?  Recent Outpatient Visits   ?      ? 2 weeks ago Cyst of left ovary  ? [facility] [place], [place] W, NP  ? 8 months ago Psoriasis  ? [facility] [place], [place], NP  ?  ?  ? ?  ?  ?  ?Refused Prescriptions Disp Refills  ?? FLUoxetine (PROZAC) 10 MG capsule [Pharmacy Med Name: FLUOXETINE 10MG  CAPSULES] 90 capsule 3  ?  Sig: TAKE 1 CAPSULE(10 MG) BY MOUTH DAILY  ?  ? Psychiatry:  Antidepressants - SSRI Passed - 03/04/2022  4:33 PM  ?  ?  Passed - Completed PHQ-2 or PHQ-9 in the last 360 days  ?  ?  Passed - Valid encounter within last 6 months  ?  Recent Outpatient Visits   ?      ? 2 weeks ago Cyst of left ovary  ? [facility] [place], [place], NP  ? 8 months ago Psoriasis  ? [facility] [place], [place], [place]  ?  ?  ? ?  ?  ?  ?? buPROPion (WELLBUTRIN XL) 150 MG 24 hr tablet [Pharmacy Med Name: BUPROPION XL  150MG  TABLETS (24 H)] 90 tablet 3  ?  Sig: TAKE 1 TABLET(150 MG) BY MOUTH DAILY  ?  ? Psychiatry: Antidepressants - bupropion Passed - 03/04/2022  4:33 PM  ?  ?  Passed - Cr in normal range and within 360 days  ?  Creat  ?Date Value Ref Range Status  ?02/12/2022 0.69 0.50 - 0.99 mg/dL Final  ?   ?  ?  Passed - AST in normal range and within 360 days  ?  AST  ?Date Value Ref Range Status  ?02/12/2022 15 10 - 35 U/L Final  ? ?SGOT(AST)  ?Date Value Ref Range Status  ?07/17/2014 25 15 - 37 Unit/L Final  ?   ?  ?  Passed - ALT in normal range and within 360 days  ?  ALT  ?Date Value Ref Range Status  ?02/12/2022  17 6 - 29 U/L Final  ? ?SGPT (ALT)  ?Date Value Ref Range Status  ?07/17/2014 20 12 - 78 U/L Final  ?   ?  ?  Passed - Completed PHQ-2 or PHQ-9 in the last 360 days  ?  ?  Passed - Last BP in normal range  ?  BP Readings from Last 1 Encounters:  ?02/12/22 136/88  ?   ?  ?  Passed - Valid encounter within last 6 months  ?  Recent Outpatient Visits   ?      ? 2 weeks ago Cyst of left ovary  ? [facility] [place], [place] W, NP  ? 8 months ago Psoriasis  ? [facility] [place], [place], NP  ?  ?  ? ?  ?  ?  ? ?

## 2022-03-04 NOTE — Telephone Encounter (Signed)
Requested Prescriptions  ?Pending Prescriptions Disp Refills  ?? FLUoxetine (PROZAC) 10 MG capsule [Pharmacy Med Name: FLUOXETINE 10MG  CAPSULES] 90 capsule 3  ?  Sig: TAKE 1 CAPSULE(10 MG) BY MOUTH DAILY  ?  ? Psychiatry:  Antidepressants - SSRI Passed - 03/04/2022  4:33 PM  ?  ?  Passed - Completed PHQ-2 or PHQ-9 in the last 360 days  ?  ?  Passed - Valid encounter within last 6 months  ?  Recent Outpatient Visits   ?      ? 2 weeks ago Cyst of left ovary  ? [facility], [place], NP  ? 8 months ago Psoriasis  ? [facility] [place], [place], NP  ?  ?  ? ?  ?  ?  ?? amLODipine (NORVASC) 5 MG tablet [Pharmacy Med Name: AMLODIPINE BESYLATE 5MG  TABLETS] 90 tablet 0  ?  Sig: TAKE 1 TABLET(5 MG) BY MOUTH DAILY  ?  ? Cardiovascular: Calcium Channel Blockers 2 Passed - 03/04/2022  4:33 PM  ?  ?  Passed - Last BP in normal range  ?  BP Readings from Last 1 Encounters:  ?02/12/22 136/88  ?   ?  ?  Passed - Last Heart Rate in normal range  ?  Pulse Readings from Last 1 Encounters:  ?02/12/22 94  ?   ?  ?  Passed - Valid encounter within last 6 months  ?  Recent Outpatient Visits   ?      ? 2 weeks ago Cyst of left ovary  ? [facility] [place], [facility] W, NP  ? 8 months ago Psoriasis  ? [facility] [place], [facility], NP  ?  ?  ? ?  ?  ?  ?? buPROPion (WELLBUTRIN XL) 150 MG 24 hr tablet [Pharmacy Med Name: BUPROPION XL 150MG  TABLETS (24 H)] 90 tablet 3  ?  Sig: TAKE 1 TABLET(150 MG) BY MOUTH DAILY  ?  ? Psychiatry: Antidepressants - bupropion Passed - 03/04/2022  4:33 PM  ?  ?  Passed - Cr in normal range and within 360 days  ?  Creat  ?Date Value Ref Range Status  ?02/12/2022 0.69 0.50 - 0.99 mg/dL Final  ?   ?  ?  Passed - AST in normal range and within 360 days  ?  AST  ?Date Value Ref Range Status  ?02/12/2022 15 10 - 35 U/L Final  ? ?SGOT(AST)  ?Date Value Ref Range Status  ?07/17/2014 25 15 - 37 Unit/L Final  ?   ?  ?  Passed - ALT in normal range and within 360  days  ?  ALT  ?Date Value Ref Range Status  ?02/12/2022 17 6 - 29 U/L Final  ? ?SGPT (ALT)  ?Date Value Ref Range Status  ?07/17/2014 20 12 - 78 U/L Final  ?   ?  ?  Passed - Completed PHQ-2 or PHQ-9 in the last 360 days  ?  ?  Passed - Last BP in normal range  ?  BP Readings from Last 1 Encounters:  ?02/12/22 136/88  ?   ?  ?  Passed - Valid encounter within last 6 months  ?  Recent Outpatient Visits   ?      ? 2 weeks ago Cyst of left ovary  ? [facility] [place], 02/14/22 W, NP  ? 8 months ago Psoriasis  ? [facility] [place], [place]  W, NP  ?  ?  ? ?  ?  ?  ?? traZODone (DESYREL) 50 MG tablet [Pharmacy Med Name: TRAZODONE 50MG  TABLETS] 90 tablet 0  ?  Sig: TAKE 1 TABLET(50 MG) BY MOUTH AT BEDTIME  ?  ? Psychiatry: Antidepressants - Serotonin Modulator Passed - 03/04/2022  4:33 PM  ?  ?  Passed - Completed PHQ-2 or PHQ-9 in the last 360 days  ?  ?  Passed - Valid encounter within last 6 months  ?  Recent Outpatient Visits   ?      ? 2 weeks ago Cyst of left ovary  ? [facility] [place], [place], NP  ? 8 months ago Psoriasis  ? [facility] [place], [place], NP  ?  ?  ? ?  ?  ?  ? ?

## 2022-03-24 ENCOUNTER — Other Ambulatory Visit: Payer: Self-pay | Admitting: Internal Medicine

## 2022-03-25 MED ORDER — CLONAZEPAM 0.5 MG PO TABS: 0.5000 mg | ORAL_TABLET | Freq: Every day | ORAL | 0 refills | Status: DC | PRN

## 2022-03-25 NOTE — Telephone Encounter (Signed)
She has been referred to the same place 3 times. They do not take her insurance. Can we get her somewhere in her network. ?

## 2022-03-30 ENCOUNTER — Ambulatory Visit: Payer: BLUE CROSS/BLUE SHIELD

## 2022-05-10 ENCOUNTER — Other Ambulatory Visit: Payer: Self-pay | Admitting: Internal Medicine

## 2022-05-12 NOTE — Telephone Encounter (Signed)
Requested Prescriptions  ?Pending Prescriptions Disp Refills  ?? atorvastatin (LIPITOR) 10 MG tablet [Pharmacy Med Name: ATORVASTATIN 10 MG TABLET] 90 tablet 0  ?  Sig: TAKE 1 TABLET BY MOUTH EVERY DAY  ?  ? Cardiovascular:  Antilipid - Statins Failed - 05/10/2022  1:49 AM  ?  ?  Failed - Lipid Panel in normal range within the last 12 months  ?  Cholesterol  ?Date Value Ref Range Status  ?02/12/2022 246 (H) <200 mg/dL Final  ? ?LDL Cholesterol (Calc)  ?Date Value Ref Range Status  ?02/12/2022 142 (H) mg/dL (calc) Final  ?  Comment:  ?  Reference range: <100 ?Marland Kitchen ?Desirable range <100 mg/dL for primary prevention;   ?<70 mg/dL for patients with CHD or diabetic patients  ?with > or = 2 CHD risk factors. ?. ?LDL-C is now calculated using the Martin-Hopkins  ?calculation, which is a validated novel method providing  ?better accuracy than the Friedewald equation in the  ?estimation of LDL-C.  ?Horald Pollen et al. Lenox Ahr. 8101;751(02): 2061-2068  ?(http://education.[employer].com/faq/FAQ164) ?  ? ?Direct LDL  ?Date Value Ref Range Status  ?03/13/2021 153.0 mg/dL Final  ?  Comment:  ?  Optimal:  <100 mg/dLNear or Above Optimal:  100-129 mg/dLBorderline High:  130-159 mg/dLHigh:  160-189 mg/dLVery High:  >190 mg/dL  ? ?HDL  ?Date Value Ref Range Status  ?02/12/2022 45 (L) > OR = 50 mg/dL Final  ? ?Triglycerides  ?Date Value Ref Range Status  ?02/12/2022 383 (H) <150 mg/dL Final  ?  Comment:  ?  . ?If a non-fasting specimen was collected, consider ?repeat triglyceride testing on a fasting specimen ?if clinically indicated.  ?Henrene Pastor al. J. of Clin. Lipidol. 2015;9:129-169. ?. ?  ? ?  ?  ?  Passed - Patient is not pregnant  ?  ?  Passed - Valid encounter within last 12 months  ?  Recent Outpatient Visits   ?      ? 2 months ago Cyst of left ovary  ? [facility] [place], [place], NP  ? 11 months ago Psoriasis  ? [facility] [place], [place], NP  ?  ?  ? ?  ?  ?  ? ?

## 2022-06-02 ENCOUNTER — Encounter: Payer: Self-pay | Admitting: Internal Medicine

## 2022-06-04 ENCOUNTER — Other Ambulatory Visit: Payer: Self-pay | Admitting: Internal Medicine

## 2022-06-04 MED ORDER — OZEMPIC (0.25 OR 0.5 MG/DOSE) 2 MG/1.5ML ~~LOC~~ SOPN: 0.2500 mg | PEN_INJECTOR | SUBCUTANEOUS | 0 refills | Status: DC

## 2022-06-05 NOTE — Telephone Encounter (Signed)
Requested medication (s) are due for refill today: no  Requested medication (s) are on the active medication list: yes  Last refill:  06/04/22  Future visit scheduled: yes  Notes to clinic:  Unable to refill per protocol, pharmacy requests:Product Backordered/Unavailable:2 MG/1.5 ML PRODUCT NO LONGER ON MARKET.    Requested Prescriptions  Pending Prescriptions Disp Refills   OZEMPIC, 0.25 OR 0.5 MG/DOSE, 2 MG/1.5ML SOPN [Pharmacy Med Name: OZEMPIC 0.25-0.5 MG/DOSE PEN]  0    Sig: Inject 0.25 mg into the skin once a week. For first 4 weeks. Then increase dose to 0.5mg  weekly     Endocrinology:  Diabetes - GLP-1 Receptor Agonists - semaglutide Failed - 06/04/2022  1:04 PM      Failed - HBA1C in normal range and within 180 days    Hgb A1c MFr Bld  Date Value Ref Range Status  02/12/2022 5.9 (H) <5.7 % of total Hgb Final    Comment:    For someone without known diabetes, a hemoglobin  A1c value between 5.7% and 6.4% is consistent with prediabetes and should be confirmed with a  follow-up test. . For someone with known diabetes, a value <7% indicates that their diabetes is well controlled. A1c targets should be individualized based on duration of diabetes, age, comorbid conditions, and other considerations. . This assay result is consistent with an increased risk of diabetes. . Currently, no consensus exists regarding use of hemoglobin A1c for diagnosis of diabetes for children. .          Passed - Cr in normal range and within 360 days    Creat  Date Value Ref Range Status  02/12/2022 0.69 0.50 - 0.99 mg/dL Final         Passed - Valid encounter within last 6 months    Recent Outpatient Visits           3 months ago Cyst of left ovary   [facility] [place], [place], NP   11 months ago Psoriasis   [facility] [place], [place], [place]

## 2022-06-10 ENCOUNTER — Other Ambulatory Visit: Payer: Self-pay | Admitting: Internal Medicine

## 2022-06-10 DIAGNOSIS — I10 Essential (primary) hypertension: Secondary | ICD-10-CM

## 2022-06-10 NOTE — Telephone Encounter (Signed)
Requested Prescriptions  Pending Prescriptions Disp Refills  . losartan (COZAAR) 100 MG tablet [Pharmacy Med Name: LOSARTAN POTASSIUM 100 MG TAB] 90 tablet     Sig: TAKE 1 TABLET BY MOUTH DAILY     Cardiovascular:  Angiotensin Receptor Blockers Passed - 06/10/2022  1:50 AM      Passed - Cr in normal range and within 180 days    Creat  Date Value Ref Range Status  02/12/2022 0.69 0.50 - 0.99 mg/dL Final         Passed - K in normal range and within 180 days    Potassium  Date Value Ref Range Status  02/12/2022 3.7 3.5 - 5.3 mmol/L Final  07/17/2014 3.6 3.5 - 5.1 mmol/L Final         Passed - Patient is not pregnant      Passed - Last BP in normal range    BP Readings from Last 1 Encounters:  02/12/22 136/88         Passed - Valid encounter within last 6 months    Recent Outpatient Visits          3 months ago Cyst of left ovary   [facility] [place], [place], NP   12 months ago Psoriasis   [facility] [place], [place], [place]

## 2022-07-02 ENCOUNTER — Other Ambulatory Visit: Payer: Self-pay | Admitting: Internal Medicine

## 2022-07-02 NOTE — Telephone Encounter (Signed)
Requested medication (s) are due for refill today - yes  Requested medication (s) are on the active medication list -yes  Future visit scheduled -no  Last refill: 03/25/22 #30  Notes to clinic: non delegated Rx  Requested Prescriptions  Pending Prescriptions Disp Refills   clonazePAM (KLONOPIN) 0.5 MG tablet [Pharmacy Med Name: CLONAZEPAM 0.5 MG TABLET] 30 tablet 0    Sig: TAKE 1 TABLET BY MOUTH EVERY DAY AS NEEDED FOR ANXIETY     Not Delegated - Psychiatry: Anxiolytics/Hypnotics 2 Failed - 07/02/2022  1:00 PM      Failed - This refill cannot be delegated      Failed - Urine Drug Screen completed in last 360 days      Passed - Patient is not pregnant      Passed - Valid encounter within last 6 months    Recent Outpatient Visits           4 months ago Cyst of left ovary   [facility] [place], [place], NP   1 year ago Psoriasis   [facility] [place], [place], NP                 Requested Prescriptions  Pending Prescriptions Disp Refills   clonazePAM (KLONOPIN) 0.5 MG tablet [Pharmacy Med Name: CLONAZEPAM 0.5 MG TABLET] 30 tablet 0    Sig: TAKE 1 TABLET BY MOUTH EVERY DAY AS NEEDED FOR ANXIETY     Not Delegated - Psychiatry: Anxiolytics/Hypnotics 2 Failed - 07/02/2022  1:00 PM      Failed - This refill cannot be delegated      Failed - Urine Drug Screen completed in last 360 days      Passed - Patient is not pregnant      Passed - Valid encounter within last 6 months    Recent Outpatient Visits           4 months ago Cyst of left ovary   [facility] [place], [place], NP   1 year ago Psoriasis   [facility] [place], [place], [place]

## 2022-08-12 ENCOUNTER — Other Ambulatory Visit: Payer: Self-pay | Admitting: Internal Medicine

## 2022-08-12 NOTE — Telephone Encounter (Signed)
Requested Prescriptions  Pending Prescriptions Disp Refills  . atorvastatin (LIPITOR) 10 MG tablet [Pharmacy Med Name: ATORVASTATIN 10 MG TABLET] 90 tablet 1    Sig: TAKE 1 TABLET BY MOUTH EVERY DAY     Cardiovascular:  Antilipid - Statins Failed - 08/12/2022  2:02 AM      Failed - Lipid Panel in normal range within the last 12 months    Cholesterol  Date Value Ref Range Status  02/12/2022 246 (H) <200 mg/dL Final   LDL Cholesterol (Calc)  Date Value Ref Range Status  02/12/2022 142 (H) mg/dL (calc) Final    Comment:    Reference range: <100 . Desirable range <100 mg/dL for primary prevention;   <70 mg/dL for patients with CHD or diabetic patients  with > or = 2 CHD risk factors. Marland Kitchen LDL-C is now calculated using the Martin-Hopkins  calculation, which is a validated novel method providing  better accuracy than the Friedewald equation in the  estimation of LDL-C.  Horald Pollen et al. Lenox Ahr. 9518;841(66): 2061-2068  (http://education.[facility].com/faq/FAQ164)    Direct LDL  Date Value Ref Range Status  03/13/2021 153.0 mg/dL Final    Comment:    Optimal:  <100 mg/dLNear or Above Optimal:  100-129 mg/dLBorderline High:  130-159 mg/dLHigh:  160-189 mg/dLVery High:  >190 mg/dL   HDL  Date Value Ref Range Status  02/12/2022 45 (L) > OR = 50 mg/dL Final   Triglycerides  Date Value Ref Range Status  02/12/2022 383 (H) <150 mg/dL Final    Comment:    . If a non-fasting specimen was collected, consider repeat triglyceride testing on a fasting specimen if clinically indicated.  Perry Mount et al. J. of Clin. Lipidol. 2015;9:129-169. Marland Kitchen          Passed - Patient is not pregnant      Passed - Valid encounter within last 12 months    Recent Outpatient Visits          6 months ago Cyst of left ovary   [facility] [place], Salvadore Oxford, NP   1 year ago Psoriasis   [facility] [place], Salvadore Oxford, [place]

## 2022-08-27 ENCOUNTER — Other Ambulatory Visit: Payer: Self-pay | Admitting: Internal Medicine

## 2022-08-27 NOTE — Telephone Encounter (Signed)
Requested Prescriptions  Pending Prescriptions Disp Refills  . amLODipine (NORVASC) 5 MG tablet [Pharmacy Med Name: AMLODIPINE BESYLATE 5 MG TAB] 90 tablet 0    Sig: Take 1 tablet (5 mg total) by mouth daily. OFFICE VISIT NEEDED FOR ADDITIONAL REFILLS     Cardiovascular: Calcium Channel Blockers 2 Failed - 08/27/2022  2:53 AM      Failed - Valid encounter within last 6 months    Recent Outpatient Visits          6 months ago Cyst of left ovary   [facility] [place], Salvad[place], NP   1 year ago Psoriasis   [facility] [place], [place], NP             Passed - Last BP in normal range    BP Readings from Last 1 Encounters:  02/12/22 136/88         Passed - Last Heart Rate in normal range    Pulse Readings from Last 1 Encounters:  02/12/22 94

## 2022-08-27 NOTE — Telephone Encounter (Signed)
Patient called, left VM to return the call to the office to schedule an OV for physical. As noted last OV notes:   Instructions   Return in about 2 months (around 04/12/2022) for annual exam.

## 2022-09-08 ENCOUNTER — Other Ambulatory Visit: Payer: Self-pay | Admitting: Internal Medicine

## 2022-09-09 NOTE — Telephone Encounter (Signed)
Left message to make appointment. Courtesy refill. Requested Prescriptions  Pending Prescriptions Disp Refills  . traZODone (DESYREL) 50 MG tablet [Pharmacy Med Name: TRAZODONE 50 MG TABLET] 30 tablet 0    Sig: TAKE 1 TABLET BY MOUTH AT BEDTIME     Psychiatry: Antidepressants - Serotonin Modulator Failed - 09/08/2022  2:27 AM      Failed - Valid encounter within last 6 months    Recent Outpatient Visits          6 months ago Cyst of left ovary   [facility] [place], Salvadore [place], NP   1 year ago Psoriasis   [facility] [place], [place], [place]             Passed - Completed PHQ-2 or PHQ-9 in the last 360 days

## 2022-09-16 ENCOUNTER — Ambulatory Visit: Payer: BLUE CROSS/BLUE SHIELD | Admitting: Internal Medicine

## 2022-10-04 ENCOUNTER — Other Ambulatory Visit: Payer: Self-pay | Admitting: Internal Medicine

## 2022-10-04 DIAGNOSIS — I10 Essential (primary) hypertension: Secondary | ICD-10-CM

## 2022-10-05 NOTE — Telephone Encounter (Signed)
Requested Prescriptions  Pending Prescriptions Disp Refills  . losartan (COZAAR) 100 MG tablet [Pharmacy Med Name: LOSARTAN POTASSIUM 100 MG TAB] 90 tablet 0    Sig: TAKE 1 TABLET BY MOUTH EVERY DAY     Cardiovascular:  Angiotensin Receptor Blockers Failed - 10/04/2022  1:11 AM      Failed - Cr in normal range and within 180 days    Creat  Date Value Ref Range Status  02/12/2022 0.69 0.50 - 0.99 mg/dL Final         Failed - K in normal range and within 180 days    Potassium  Date Value Ref Range Status  02/12/2022 3.7 3.5 - 5.3 mmol/L Final  07/17/2014 3.6 3.5 - 5.1 mmol/L Final         Failed - Valid encounter within last 6 months    Recent Outpatient Visits          7 months ago Cyst of left ovary   [facility] [place], Coralie Keens, NP   1 year ago [place], NP      Future Appointments            In 2 days Garnette Gunner, Coralie Keens, NP [facility], [place] - Patient is not pregnant      Passed - Last BP in normal range    BP Readings from Last 1 Encounters:  02/12/22 136/88

## 2022-10-07 ENCOUNTER — Ambulatory Visit: Payer: BLUE CROSS/BLUE SHIELD | Admitting: Internal Medicine

## 2022-10-12 ENCOUNTER — Other Ambulatory Visit: Payer: Self-pay | Admitting: Internal Medicine

## 2022-10-12 NOTE — Telephone Encounter (Signed)
Requested medications are due for refill today.  yes  Requested medications are on the active medications list.  yes  Last refill. 09/09/2022 #30 0 rf  Future visit scheduled.   Yes 10/13/2022  Notes to clinic.  Courtesy refill already given.    Requested Prescriptions  Pending Prescriptions Disp Refills   traZODone (DESYREL) 50 MG tablet [Pharmacy Med Name: TRAZODONE 50 MG TABLET] 30 tablet 0    Sig: TAKE 1 TABLET BY MOUTH EVERYDAY AT BEDTIME     Psychiatry: Antidepressants - Serotonin Modulator Failed - 10/12/2022  1:33 AM      Failed - Valid encounter within last 6 months    Recent Outpatient Visits           8 months ago Cyst of left ovary   [facility] [place], Coralie Keens, NP   1 year ago [facility] [place], Coralie Keens, NP       Future Appointments             Tomorrow Garnette Gunner, Coralie Keens, NP [place] - Completed PHQ-2 or PHQ-9 in the last 360 days

## 2022-10-13 ENCOUNTER — Ambulatory Visit: Payer: BLUE CROSS/BLUE SHIELD | Admitting: Internal Medicine

## 2022-10-13 ENCOUNTER — Encounter: Payer: Self-pay | Admitting: Internal Medicine

## 2022-10-13 VITALS — BP 136/86 | HR 77 | Temp 97.1°F | Ht 63.0 in | Wt 232.0 lb

## 2022-10-13 DIAGNOSIS — Z1211 Encounter for screening for malignant neoplasm of colon: Secondary | ICD-10-CM | POA: Diagnosis not present

## 2022-10-13 DIAGNOSIS — R7303 Prediabetes: Secondary | ICD-10-CM | POA: Diagnosis not present

## 2022-10-13 DIAGNOSIS — Z0001 Encounter for general adult medical examination with abnormal findings: Secondary | ICD-10-CM

## 2022-10-13 DIAGNOSIS — Z1231 Encounter for screening mammogram for malignant neoplasm of breast: Secondary | ICD-10-CM

## 2022-10-13 DIAGNOSIS — G4700 Insomnia, unspecified: Secondary | ICD-10-CM | POA: Insufficient documentation

## 2022-10-13 DIAGNOSIS — Z6841 Body Mass Index (BMI) 40.0 and over, adult: Secondary | ICD-10-CM

## 2022-10-13 NOTE — Patient Instructions (Signed)
Health Maintenance for Postmenopausal Women Menopause is a normal process in which your ability to get pregnant comes to an end. This process happens slowly over many months or years, usually between the ages of 48 and 55. Menopause is complete when you have missed your menstrual period for 12 months. It is important to talk with your health care provider about some of the most common conditions that affect women after menopause (postmenopausal women). These include heart disease, cancer, and bone loss (osteoporosis). Adopting a healthy lifestyle and getting preventive care can help to promote your health and wellness. The actions you take can also lower your chances of developing some of these common conditions. What are the signs and symptoms of menopause? During menopause, you may have the following symptoms: Hot flashes. These can be moderate or severe. Night sweats. Decrease in sex drive. Mood swings. Headaches. Tiredness (fatigue). Irritability. Memory problems. Problems falling asleep or staying asleep. Talk with your health care provider about treatment options for your symptoms. Do I need hormone replacement therapy? Hormone replacement therapy is effective in treating symptoms that are caused by menopause, such as hot flashes and night sweats. Hormone replacement carries certain risks, especially as you become older. If you are thinking about using estrogen or estrogen with progestin, discuss the benefits and risks with your health care provider. How can I reduce my risk for heart disease and stroke? The risk of heart disease, heart attack, and stroke increases as you age. One of the causes may be a change in the body's hormones during menopause. This can affect how your body uses dietary fats, triglycerides, and cholesterol. Heart attack and stroke are medical emergencies. There are many things that you can do to help prevent heart disease and stroke. Watch your blood pressure High  blood pressure causes heart disease and increases the risk of stroke. This is more likely to develop in people who have high blood pressure readings or are overweight. Have your blood pressure checked: Every 3-5 years if you are 18-39 years of age. Every year if you are 40 years old or older. Eat a healthy diet  Eat a diet that includes plenty of vegetables, fruits, low-fat dairy products, and lean protein. Do not eat a lot of foods that are high in solid fats, added sugars, or sodium. Get regular exercise Get regular exercise. This is one of the most important things you can do for your health. Most adults should: Try to exercise for at least 150 minutes each week. The exercise should increase your heart rate and make you sweat (moderate-intensity exercise). Try to do strengthening exercises at least twice each week. Do these in addition to the moderate-intensity exercise. Spend less time sitting. Even light physical activity can be beneficial. Other tips Work with your health care provider to achieve or maintain a healthy weight. Do not use any products that contain nicotine or tobacco. These products include cigarettes, chewing tobacco, and vaping devices, such as e-cigarettes. If you need help quitting, ask your health care provider. Know your numbers. Ask your health care provider to check your cholesterol and your blood sugar (glucose). Continue to have your blood tested as directed by your health care provider. Do I need screening for cancer? Depending on your health history and family history, you may need to have cancer screenings at different stages of your life. This may include screening for: Breast cancer. Cervical cancer. Lung cancer. Colorectal cancer. What is my risk for osteoporosis? After menopause, you may be   at increased risk for osteoporosis. Osteoporosis is a condition in which bone destruction happens more quickly than new bone creation. To help prevent osteoporosis or  the bone fractures that can happen because of osteoporosis, you may take the following actions: If you are 19-50 years old, get at least 1,000 mg of calcium and at least 600 international units (IU) of vitamin D per day. If you are older than age 50 but younger than age 70, get at least 1,200 mg of calcium and at least 600 international units (IU) of vitamin D per day. If you are older than age 70, get at least 1,200 mg of calcium and at least 800 international units (IU) of vitamin D per day. Smoking and drinking excessive alcohol increase the risk of osteoporosis. Eat foods that are rich in calcium and vitamin D, and do weight-bearing exercises several times each week as directed by your health care provider. How does menopause affect my mental health? Depression may occur at any age, but it is more common as you become older. Common symptoms of depression include: Feeling depressed. Changes in sleep patterns. Changes in appetite or eating patterns. Feeling an overall lack of motivation or enjoyment of activities that you previously enjoyed. Frequent crying spells. Talk with your health care provider if you think that you are experiencing any of these symptoms. General instructions See your health care provider for regular wellness exams and vaccines. This may include: Scheduling regular health, dental, and eye exams. Getting and maintaining your vaccines. These include: Influenza vaccine. Get this vaccine each year before the flu season begins. Pneumonia vaccine. Shingles vaccine. Tetanus, diphtheria, and pertussis (Tdap) booster vaccine. Your health care provider may also recommend other immunizations. Tell your health care provider if you have ever been abused or do not feel safe at home. Summary Menopause is a normal process in which your ability to get pregnant comes to an end. This condition causes hot flashes, night sweats, decreased interest in sex, mood swings, headaches, or lack  of sleep. Treatment for this condition may include hormone replacement therapy. Take actions to keep yourself healthy, including exercising regularly, eating a healthy diet, watching your weight, and checking your blood pressure and blood sugar levels. Get screened for cancer and depression. Make sure that you are up to date with all your vaccines. This information is not intended to replace advice given to you by your health care provider. Make sure you discuss any questions you have with your health care provider. Document Revised: 05/05/2021 Document Reviewed: 05/05/2021 Elsevier Patient Education  2023 Elsevier Inc.  

## 2022-10-13 NOTE — Assessment & Plan Note (Signed)
Encourage diet and exercise for weight loss 

## 2022-10-13 NOTE — Progress Notes (Signed)
Subjective:    Patient ID: Jasmine Miller, female    DOB: 02/19/1976, 46 y.o.   MRN: [id number]  HPI  Patient presents to clinic today for her annual exam.  Flu: never Tetanus: unsure COVID: never Pap smear: 11/2017 Mammogram: 08/2018 Colon screening: never Vision screening: annually Dentist: biannually  Diet: She does eat meat. She consumes fruits and veggies. She does eat some fried foods. She drinks mostly water. Exercise: Gym  Review of Systems     Past Medical History:  Diagnosis Date   Hypertension     Current Outpatient Medications  Medication Sig Dispense Refill   amLODipine (NORVASC) 5 MG tablet Take 1 tablet (5 mg total) by mouth daily. OFFICE VISIT NEEDED FOR ADDITIONAL REFILLS 90 tablet 0   atorvastatin (LIPITOR) 10 MG tablet TAKE 1 TABLET BY MOUTH EVERY DAY 90 tablet 1   clonazePAM (KLONOPIN) 0.5 MG tablet TAKE 1 TABLET BY MOUTH EVERY DAY AS NEEDED FOR ANXIETY 30 tablet 0   doxycycline (VIBRA-TABS) 100 MG tablet Take 1 tablet (100 mg total) by mouth 2 (two) times daily. 14 tablet 0   HUMIRA PEN 40 MG/0.4ML PNKT Inject 40 mg into the skin every 14 (fourteen) days.     levofloxacin (LEVAQUIN) 500 MG tablet Take 1 tablet (500 mg total) by mouth daily. 7 tablet 0   losartan (COZAAR) 100 MG tablet TAKE 1 TABLET BY MOUTH EVERY DAY 90 tablet 0   OZEMPIC, 0.25 OR 0.5 MG/DOSE, 2 MG/3ML SOPN Inject 0.5 mg into the skin once a week. Start with 0.105m weekly x 4 weeks then increase to 0.561mweekly injection. 9 mL 0   phentermine 15 MG capsule Take 1 capsule (15 mg total) by mouth every morning. 30 capsule 0   traZODone (DESYREL) 50 MG tablet TAKE 1 TABLET BY MOUTH AT BEDTIME 30 tablet 0   No current facility-administered medications for this visit.    Allergies  Allergen Reactions   Chantix [Varenicline Tartrate] Swelling    Lip swelling and rash   Penicillins Anaphylaxis    Family History  Problem Relation Age of Onset   Breast cancer Maternal Aunt     Breast cancer Maternal Grandmother    Breast cancer Maternal Aunt     Social History   Socioeconomic History   Marital status: Single    Spouse name: Not on file   Number of children: Not on file   Years of education: Not on file   Highest education level: Not on file  Occupational History   Not on file  Tobacco Use   Smoking status: Some Days    Types: E-cigarettes   Smokeless tobacco: Never   Tobacco comments:    only smokes E-Cigarettes  Substance and Sexual Activity   Alcohol use: Yes   Drug use: No   Sexual activity: Not on file  Other Topics Concern   Not on file  Social History Narrative   Not on file   Social Determinants of Health   Financial Resource Strain: Not on file  Food Insecurity: Not on file  Transportation Needs: Not on file  Physical Activity: Not on file  Stress: Not on file  Social Connections: Not on file  Intimate Partner Violence: Not on file     Constitutional: Denies fever, malaise, fatigue, headache or abrupt weight changes.  HEENT: Denies eye pain, eye redness, ear pain, ringing in the ears, wax buildup, runny nose, nasal congestion, bloody nose, or sore throat. Respiratory: Denies difficulty breathing, shortness of breath,  cough or sputum production.   Cardiovascular: Denies chest pain, chest tightness, palpitations or swelling in the hands or feet.  Gastrointestinal: Pt reports intermittent reflux, incomplete bowel emptying. Denies abdominal pain, bloating, constipation, diarrhea or blood in the stool.  GU: Denies urgency, frequency, pain with urination, burning sensation, blood in urine, odor or discharge. Musculoskeletal: Denies decrease in range of motion, difficulty with gait, muscle pain or joint pain and swelling.  Skin: Denies redness, rashes, lesions or ulcercations.  Neurological: Pt reports insomnia. Denies dizziness, difficulty with memory, difficulty with speech or problems with balance and coordination.  Psych: Patient has  a history of anxiety and depression.  Denies SI/HI.  No other specific complaints in a complete review of systems (except as listed in HPI above).  Objective:   Physical Exam  BP 136/86 (BP Location: Right Arm, Patient Position: Sitting, Cuff Size: Large)   Pulse 77   Temp (!) 97.1 F (36.2 C) (Temporal)   Ht '5\' 3"'  (1.6 m)   Wt 232 lb (105.2 kg)   SpO2 99%   BMI 41.10 kg/m   Wt Readings from Last 3 Encounters:  02/12/22 224 lb (101.6 kg)  06/12/21 224 lb 3.2 oz (101.7 kg)  03/13/21 219 lb (99.3 kg)    General: Appears her stated age, obese in NAD. Skin: Warm, dry and intact.  HEENT: Head: normal shape and size; Eyes: sclera white, no icterus, conjunctiva pink, PERRLA and EOMs intact;  Neck:  Neck supple, trachea midline. No masses, lumps or thyromegaly present.  Cardiovascular: Normal rate and rhythm. S1,S2 noted.  No murmur, rubs or gallops noted. No JVD or BLE edema.  Pulmonary/Chest: Normal effort and positive vesicular breath sounds. No respiratory distress. No wheezes, rales or ronchi noted.  Abdomen:  Normal bowel sounds.  Musculoskeletal: Strength 5/5 BUE/BLE.  No difficulty with gait.  Neurological: Alert and oriented. Cranial nerves II-XII grossly intact. Coordination normal.  Psychiatric: Mood and affect normal. Behavior is normal. Judgment and thought content normal.    BMET    Component Value Date/Time   NA 140 02/12/2022 1501   NA 142 04/06/2019 1554   NA 137 07/17/2014 1237   K 3.7 02/12/2022 1501   K 3.6 07/17/2014 1237   CL 104 02/12/2022 1501   CL 105 07/17/2014 1237   CO2 27 02/12/2022 1501   CO2 28 07/17/2014 1237   GLUCOSE 80 02/12/2022 1501   GLUCOSE 103 (H) 07/17/2014 1237   BUN 14 02/12/2022 1501   BUN 13 04/06/2019 1554   BUN 8 07/17/2014 1237   CREATININE 0.69 02/12/2022 1501   CALCIUM 9.2 02/12/2022 1501   CALCIUM 8.5 07/17/2014 1237   GFRNONAA 108 04/06/2019 1554   GFRNONAA >60 07/17/2014 1237   GFRAA 125 04/06/2019 1554   GFRAA >60  07/17/2014 1237    Lipid Panel     Component Value Date/Time   CHOL 246 (H) 02/12/2022 1501   TRIG 383 (H) 02/12/2022 1501   HDL 45 (L) 02/12/2022 1501   CHOLHDL 5.5 (H) 02/12/2022 1501   VLDL 68.4 (H) 03/13/2021 1036   LDLCALC 142 (H) 02/12/2022 1501    CBC    Component Value Date/Time   WBC 8.0 02/12/2022 1501   RBC 4.37 02/12/2022 1501   HGB 12.6 02/12/2022 1501   HGB 13.5 07/17/2014 1237   HCT 36.9 02/12/2022 1501   HCT 40.2 07/17/2014 1237   PLT 193 02/12/2022 1501   PLT 204 07/17/2014 1237   MCV 84.4 02/12/2022 1501   MCV  89 07/17/2014 1237   MCH 28.8 02/12/2022 1501   MCHC 34.1 02/12/2022 1501   RDW 12.6 02/12/2022 1501   RDW 13.3 07/17/2014 1237    Hgb A1C Lab Results  Component Value Date   HGBA1C 5.9 (H) 02/12/2022           Assessment & Plan:   Preventative Health Maintenance:  She declines flu shot today She declines tetanus today Encouraged her to get her COVID-vaccine She longer needs Pap smears Mammogram ordered-she will call to schedule Referral to GI for screening colonoscopy Encouraged her to consume a balanced diet and exercise regimen Asked her to see an eye doctor and dentist annually We will check CBC, c-Met, lipid, A1c today  RTC in 6 months, follow-up chronic conditions Webb [place], NP

## 2022-10-14 LAB — HEMOGLOBIN A1C
Hgb A1c MFr Bld: 5.9 % of total Hgb — ABNORMAL HIGH (ref <5.7)
Mean Plasma Glucose: 123 mg/dL
eAG (mmol/L): 6.8 mmol/L

## 2022-10-14 LAB — COMPLETE METABOLIC PANEL WITH GFR
AG Ratio: 1.2 (calc) (ref 1.0–2.5)
ALT: 21 U/L (ref 6–29)
AST: 18 U/L (ref 10–35)
Albumin: 4.1 g/dL (ref 3.6–5.1)
Alkaline phosphatase (APISO): 74 U/L (ref 31–125)
BUN: 15 mg/dL (ref 7–25)
CO2: 27 mmol/L (ref 20–32)
Calcium: 9.1 mg/dL (ref 8.6–10.2)
Chloride: 103 mmol/L (ref 98–110)
Creat: 0.7 mg/dL (ref 0.50–0.99)
Globulin: 3.3 g/dL (calc) (ref 1.9–3.7)
Glucose, Bld: 109 mg/dL — ABNORMAL HIGH (ref 65–99)
Potassium: 3.7 mmol/L (ref 3.5–5.3)
Sodium: 139 mmol/L (ref 135–146)
Total Bilirubin: 0.4 mg/dL (ref 0.2–1.2)
Total Protein: 7.4 g/dL (ref 6.1–8.1)
eGFR: 108 mL/min/{1.73_m2} (ref >=60)

## 2022-10-14 LAB — CBC
HCT: 39.4 % (ref 35.0–45.0)
Hemoglobin: 13 g/dL (ref 11.7–15.5)
MCH: 28.5 pg (ref 27.0–33.0)
MCHC: 33 g/dL (ref 32.0–36.0)
MCV: 86.4 fL (ref 80.0–100.0)
MPV: 11.4 fL (ref 7.5–12.5)
Platelets: 195 10*3/uL (ref 140–400)
RBC: 4.56 10*6/uL (ref 3.80–5.10)
RDW: 13.1 % (ref 11.0–15.0)
WBC: 7.3 10*3/uL (ref 3.8–10.8)

## 2022-10-14 LAB — LIPID PANEL
Cholesterol: 181 mg/dL (ref <200)
HDL: 53 mg/dL (ref >=50)
LDL Cholesterol (Calc): 96 mg/dL (calc)
Non-HDL Cholesterol (Calc): 128 mg/dL (calc) (ref <130)
Total CHOL/HDL Ratio: 3.4 (calc) (ref <5.0)
Triglycerides: 227 mg/dL — ABNORMAL HIGH (ref <150)

## 2022-11-07 ENCOUNTER — Other Ambulatory Visit: Payer: Self-pay | Admitting: Internal Medicine

## 2022-11-09 NOTE — Telephone Encounter (Signed)
Requested medications are due for refill today.  yes  Requested medications are on the active medications list.  yes  Last refill. 07/02/2022 #30   Future visit scheduled.   no  Notes to clinic.  Refill not delegated    Requested Prescriptions  Pending Prescriptions Disp Refills   clonazePAM (KLONOPIN) 0.5 MG tablet [Pharmacy Med Name: CLONAZEPAM 0.5 MG TABLET] 30 tablet 0    Sig: TAKE 1 TABLET BY MOUTH EVERY DAY AS NEEDED FOR ANXIETY     Not Delegated - Psychiatry: Anxiolytics/Hypnotics 2 Failed - 11/07/2022  2:14 PM      Failed - This refill cannot be delegated      Failed - Urine Drug Screen completed in last 360 days      Passed - Patient is not pregnant      Passed - Valid encounter within last 6 months    Recent Outpatient Visits           3 weeks ago Encounter for general adult medical examination with abnormal findings   [facility] [place], Salvadore Oxford, NP   9 months ago Cyst of left ovary   [facility] [place], Salvadore Oxford, NP   1 year ago Psoriasis   [facility] [place], Salvadore [place], [place]

## 2022-11-17 ENCOUNTER — Ambulatory Visit: Payer: Self-pay | Admitting: *Deleted

## 2022-11-17 ENCOUNTER — Ambulatory Visit: Payer: BLUE CROSS/BLUE SHIELD | Admitting: Family Medicine

## 2022-11-17 ENCOUNTER — Encounter: Payer: Self-pay | Admitting: Family Medicine

## 2022-11-17 VITALS — BP 138/70 | HR 79 | Ht 63.0 in | Wt 226.0 lb

## 2022-11-17 DIAGNOSIS — H6993 Unspecified Eustachian tube disorder, bilateral: Secondary | ICD-10-CM

## 2022-11-17 DIAGNOSIS — A084 Viral intestinal infection, unspecified: Secondary | ICD-10-CM

## 2022-11-17 DIAGNOSIS — R112 Nausea with vomiting, unspecified: Secondary | ICD-10-CM | POA: Diagnosis not present

## 2022-11-17 MED ORDER — ONDANSETRON 4 MG PO TBDP: 4.0000 mg | ORAL_TABLET | Freq: Three times a day (TID) | ORAL | 0 refills | Status: DC | PRN

## 2022-11-17 MED ORDER — FLUTICASONE PROPIONATE 50 MCG/ACT NA SUSP: 2.0000 | Freq: Every day | NASAL | 3 refills | Status: DC

## 2022-11-17 NOTE — Telephone Encounter (Signed)
Reason for Disposition  [1] MILD pain (e.g., does not interfere with normal activities) AND [2] pain comes and goes (cramps) AND [3] present > 48 hours  (Exception: This same abdominal pain is a chronic symptom recurrent or ongoing AND present > 4 weeks.)    Stomach virus going around at her work.  Feeling much better now.   Needs work note  Answer Assessment - Initial Assessment Questions 1. LOCATION: "Where does it hurt?"      Abd pain and diarrhea.   I need to see dr. For a work note.    Cramping with the diarrhea.   Other co workers have been sick too.   Not vomited since midnight.  I'm actually feeling much better now.  I just have to do this for a work note. 2. RADIATION: "Does the pain shoot anywhere else?" (e.g., chest, back)     No 3. ONSET: "When did the pain begin?" (e.g., minutes, hours or days ago)      Since Sunday but I'm much better now.    Not vomited since last night and feel better this morning 4. SUDDEN: "Gradual or sudden onset?"     Not asked 5. PATTERN "Does the pain come and go, or is it constant?"    - If it comes and goes: "How long does it last?" "Do you have pain now?"     (Note: Comes and goes means the pain is intermittent. It goes away completely between bouts.)    - If constant: "Is it getting better, staying the same, or getting worse?"      (Note: Constant means the pain never goes away completely; most serious pain is constant and gets worse.)      Getting better 6. SEVERITY: "How bad is the pain?"  (e.g., Scale 1-10; mild, moderate, or severe)    - MILD (1-3): Doesn't interfere with normal activities, abdomen soft and not tender to touch.     - MODERATE (4-7): Interferes with normal activities or awakens from sleep, abdomen tender to touch.     - SEVERE (8-10): Excruciating pain, doubled over, unable to do any normal activities.       Mild cramping with diarrhea 7. RECURRENT SYMPTOM: "Have you ever had this type of stomach pain before?" If Yes, ask: "When  was the last time?" and "What happened that time?"      Not asked 8. CAUSE: "What do you think is causing the stomach pain?"     Stomach virus that is going around at work. 9. RELIEVING/AGGRAVATING FACTORS: "What makes it better or worse?" (e.g., antacids, bending or twisting motion, bowel movement)     Not asked 10. OTHER SYMPTOMS: "Do you have any other symptoms?" (e.g., back pain, diarrhea, fever, urination pain, vomiting)       Not asked at this point since she is feeling better 11. PREGNANCY: "Is there any chance you are pregnant?" "When was your last menstrual period?"       Not asked  Protocols used: Abdominal Pain - [facility]

## 2022-11-17 NOTE — Patient Instructions (Addendum)
Thank you for coming to the office today.  Goal to gradually improve hydration / nutrition  Zofran as needed for nausea or vomiting  OTC Peppermint Oil (Triple Coated Capsule) 180mg  take one 3 times daily to reduce diarrhea  For ears, looks like fluid behind ears, eustachian tube dysfunction  Start nasal steroid Flonase 2 sprays in each nostril daily for 4-6 weeks, may repeat course seasonally or as needed  Can add ibuprofen / sudafed as needed   Please schedule a Follow-up Appointment to: Return if symptoms worsen or fail to improve.  If you have any other questions or concerns, please feel free to call the office or send a message through MyChart. You may also schedule an earlier appointment if necessary.  Additionally, you may be receiving a survey about your experience at our office within a few days to 1 week by e-mail or mail. We value your feedback.  , DO [facility], [facility]

## 2022-11-17 NOTE — Telephone Encounter (Signed)
  Chief Complaint: abd pain with diarrhea.   Stomach virus going around at her work Symptoms: cramping with diarrhea.   "I'm actually feeling much better now".    "I need a note for work". Frequency: Since Sunday Pertinent Negatives: Patient denies vomiting since midnight Disposition: [] ED /[] Urgent Care (no appt availability in office) / [x] Appointment(In office/virtual)/ []  [facility] [facility]/ [] Home Care/ [] Refused Recommended Disposition /[] [place] [facility]/ []  Follow-up with PCP Additional Notes: Appt. Made for today at 3:20 with Dr. .

## 2022-11-17 NOTE — Progress Notes (Signed)
Subjective:    Patient ID: Jasmine Miller, female    DOB: 1976/04/05, 46 y.o.   MRN: [id number]  Jasmine Miller is a 46 y.o. female presenting on 11/17/2022 for Abdominal Pain and Diarrhea  Patient presents for a same day appointment.  PCP Webb Silversmith, FNP   HPI  Viral Gastroenteritis   Reports new onset problem 2 days ago _0 /21/2023    3:17 PM 10/13/2022    9:29 AM 02/12/2022    2:47 PM  Depression screen PHQ 2/9  Decreased Interest _1 Down, Depressed, Hopeless 0 0 0  PHQ - 2 Score _2 Altered sleeping _3 Tired, decreased energy _4 Change in appetite _5 Feeling bad or failure about yourself  0 1 0  Trouble concentrating _6 Moving slowly or fidgety/restless 0 0 0  Suicidal thoughts 0 0 0  PHQ-9 Score _7 Difficult doing work/chores Somewhat difficult Somewhat difficult Very difficult    Social History   Tobacco Use   Smoking status: Some Days    Types: E-cigarettes   Smokeless tobacco: Never   Tobacco comments:    only smokes E-Cigarettes  Vaping Use   Vaping Use: Every day  Substance Use Topics   Alcohol use: Yes   Drug use: No    Review of Systems Per HPI unless specifically indicated above      Objective:    BP (!) 148/72   Pulse 79   Ht _8  (1.6 m)   Wt 226 lb (102.5 kg)   SpO2 100%   BMI 40.03 kg/m   Wt Readings from Last 3 Encounters:  11/17/22 226 lb (102.5 kg)  10/13/22 232 lb (105.2 kg)  02/12/22 224 lb (101.6 kg)    Physical Exam Vitals and nursing note reviewed.  Constitutional:      General: She is not in acute distress.    Appearance: She is well-developed. She is not diaphoretic.     Comments: Well-appearing, comfortable, cooperative  HENT:     Head: Normocephalic and atraumatic.     Comments: R>L Tm with clear effusion Eyes:     General:        Right eye: No discharge.        Left eye: No discharge.     Conjunctiva/sclera: Conjunctivae normal.  Neck:     Thyroid: No thyromegaly.  Cardiovascular:     Rate and Rhythm: Normal rate and regular rhythm.     Heart sounds: Normal heart sounds. No murmur heard. Pulmonary:     Effort: Pulmonary effort is normal. No respiratory distress.     Breath sounds: Normal breath sounds. No wheezing or rales.  Abdominal:     General: Bowel sounds are normal. There is no distension.     Palpations: Abdomen is soft.     Tenderness: There is no abdominal tenderness.  Musculoskeletal:        General: Normal range of motion.     Cervical back: Normal range of motion and neck supple.  Lymphadenopathy:     Cervical: No cervical adenopathy.  Skin:    General: Skin is warm and dry.     Findings: No erythema or rash.  Neurological:     Mental Status: She is alert and oriented to person, place, and time.  Psychiatric:        Behavior: Behavior normal.     Comments: Well groomed, good eye contact, normal speech and thoughts    Results for orders placed or performed in visit on 10/13/22  CBC  Result Value Ref Range   WBC 7.3 3.8 - 10.8 Thousand/uL   RBC 4.56 3.80 - 5.10 Million/uL   Hemoglobin 13.0 11.7 - 15.5 g/dL   HCT 39.4 35.0 - 45.0 %   MCV 86.4 80.0 - 100.0 fL   MCH 28.5 27.0 - 33.0 pg   MCHC 33.0 32.0 -  36.0 g/dL   RDW 13.1 11.0 - 15.0 %   Platelets 195 140 - 400 Thousand/uL   MPV 11.4 7.5 - 12.5 fL  COMPLETE METABOLIC PANEL WITH GFR  Result Value Ref Range   Glucose, Bld 109 (H) 65 - 99 mg/dL   BUN 15 7 - 25 mg/dL   Creat 0.70 0.50 - 0.99 mg/dL   eGFR 108 > OR = 60 mL/min/1.40m   BUN/Creatinine Ratio SEE NOTE: 6 - 22 (calc)   Sodium 139 135 - 146 mmol/L   Potassium 3.7 3.5 - 5.3 mmol/L   Chloride 103 98 - 110 mmol/L   CO2 27 20 - 32 mmol/L   Calcium 9.1 8.6 - 10.2 mg/dL   Total Protein 7.4 6.1 - 8.1 g/dL   Albumin 4.1 3.6 - 5.1 g/dL   Globulin 3.3 1.9 - 3.7 g/dL (calc)   AG Ratio 1.2 1.0 - 2.5 (calc)   Total Bilirubin 0.4 0.2 - 1.2 mg/dL   Alkaline phosphatase (APISO) 74 31 - 125 U/L   AST 18 10 - 35 U/L   ALT 21 6 - 29 U/L  Lipid panel  Result Value Ref Range   Cholesterol 181 <200 mg/dL   HDL 53 > OR = 50 mg/dL   Triglycerides 227 (H) <150 mg/dL   LDL Cholesterol (Calc) 96 mg/dL (calc)   Total CHOL/HDL Ratio 3.4 <5.0 (calc)   Non-HDL Cholesterol (Calc) 128 <130 mg/dL (calc)  Hemoglobin A1c  Result Value Ref Range   Hgb A1c MFr Bld 5.9 (H) <5.7 % of total Hgb   Mean Plasma Glucose 123 mg/dL   eAG (mmol/L) 6.8 mmol/L      Assessment & Plan:   Problem List Items Addressed This Visit   None Visit Diagnoses     Viral gastroenteritis    -  Primary   Relevant Medications   ondansetron (ZOFRAN-ODT) 4 MG disintegrating tablet   Nausea and vomiting, unspecified vomiting type       Relevant Medications   ondansetron (ZOFRAN-ODT) 4 MG disintegrating tablet   Dysfunction of both eustachian tubes       Relevant Medications   fluticasone (FLONASE) 50 MCG/ACT nasal spray       Goal to gradually improve hydration / nutrition  Oral rehydration advice  Imodium  OTC PRN  Rx Zofran as needed for nausea or vomiting  OTC Peppermint Oil (Triple Coated Capsule) 198m take one 3 times daily to reduce diarrhea  For ears, looks like fluid behind ears, eustachian tube  dysfunction  Start nasal steroid Flonase 2 sprays in each nostril daily for 4-6 weeks, may repeat course seasonally or as needed  Can add ibuprofen / sudafed as needed  Work note printed  Meds ordered this encounter  Medications   ondansetron (ZOFRAN-ODT) 4 MG disintegrating tablet    Sig: Take 1 tablet (4 mg total) by mouth every 8 (eight) hours as needed for nausea or vomiting.    Dispense:  30 tablet    Refill:  0   fluticasone (FLONASE) 50 MCG/ACT nasal spray    Sig: Place 2 sprays into both nostrils daily. Use for 4-6 weeks then stop and use seasonally or as needed.    Dispense:  16 g    Refill:  3      Follow up plan: Return if symptoms worsen or fail to improve.   [facility] 11/17/2022, 3:24 PM

## 2023-01-12 ENCOUNTER — Other Ambulatory Visit: Payer: Self-pay | Admitting: Internal Medicine

## 2023-01-12 DIAGNOSIS — I10 Essential (primary) hypertension: Secondary | ICD-10-CM

## 2023-01-12 NOTE — Telephone Encounter (Signed)
Requested Prescriptions  Pending Prescriptions Disp Refills   losartan (COZAAR) 100 MG tablet [Pharmacy Med Name: LOSARTAN POTASSIUM 100 MG TAB] 90 tablet 1    Sig: TAKE 1 TABLET BY MOUTH EVERY DAY     Cardiovascular:  Angiotensin Receptor Blockers Passed - 01/12/2023  2:01 PM      Passed - Cr in normal range and within 180 days    Creat  Date Value Ref Range Status  10/13/2022 0.70 0.50 - 0.99 mg/dL Final         Passed - K in normal range and within 180 days    Potassium  Date Value Ref Range Status  10/13/2022 3.7 3.5 - 5.3 mmol/L Final  07/17/2014 3.6 3.5 - 5.1 mmol/L Final         Passed - Patient is not pregnant      Passed - Last BP in normal range    BP Readings from Last 1 Encounters:  11/17/22 138/70         Passed - Valid encounter within last 6 months    Recent Outpatient Visits           1 month ago Viral gastroenteritis   [place], DO   3 months ago Encounter for general adult medical examination with abnormal findings   [facility] [place], Coralie Keens, NP   11 months ago Cyst of left ovary   [facility] [place], Coralie Keens, NP   1 year ago [facility] [place], Coralie Keens, [place]

## 2023-02-04 ENCOUNTER — Encounter: Payer: Self-pay | Admitting: Internal Medicine

## 2023-02-04 ENCOUNTER — Ambulatory Visit: Payer: BLUE CROSS/BLUE SHIELD | Admitting: Internal Medicine

## 2023-02-04 VITALS — BP 110/80 | HR 90 | Temp 97.5°F | Wt 224.0 lb

## 2023-02-04 DIAGNOSIS — R509 Fever, unspecified: Secondary | ICD-10-CM | POA: Diagnosis not present

## 2023-02-04 DIAGNOSIS — A084 Viral intestinal infection, unspecified: Secondary | ICD-10-CM

## 2023-02-04 LAB — POCT INFLUENZA A/B
Influenza A, POC: NEGATIVE
Influenza B, POC: NEGATIVE

## 2023-02-04 LAB — POC COVID19 BINAXNOW: SARS Coronavirus 2 Ag: NEGATIVE

## 2023-02-04 NOTE — Progress Notes (Signed)
Subjective:    Patient ID: Jasmine Miller, female    DOB: 1976/08/25, 47 y.o.   MRN: [id number]  HPI  Patient presents to clinic today with complaint of headache, runny nose, and diarrhea. This started last night.  She denies nasal congestion, ear pain, sore throat, cough, shortness of breath, nausea or vomiting.  She has had a fever, chills and body aches. She has tried Ibuprofen OTC with minimal relief of symptoms.  She has had sick contacts diagnosed with COVID.  Review of Systems     Past Medical History:  Diagnosis Date   Hypertension     Current Outpatient Medications  Medication Sig Dispense Refill   amLODipine (NORVASC) 5 MG tablet Take 1 tablet (5 mg total) by mouth daily. OFFICE VISIT NEEDED FOR ADDITIONAL REFILLS 90 tablet 0   atorvastatin (LIPITOR) 10 MG tablet TAKE 1 TABLET BY MOUTH EVERY DAY 90 tablet 1   clonazePAM (KLONOPIN) 0.5 MG tablet TAKE 1 TABLET BY MOUTH EVERY DAY AS NEEDED FOR ANXIETY 30 tablet 0   ENBREL SURECLICK 50 MG/ML injection Inject into the skin once a week.     fluticasone (FLONASE) 50 MCG/ACT nasal spray Place 2 sprays into both nostrils daily. Use for 4-6 weeks then stop and use seasonally or as needed. 16 g 3   losartan (COZAAR) 100 MG tablet TAKE 1 TABLET BY MOUTH EVERY DAY 90 tablet 1   ondansetron (ZOFRAN-ODT) 4 MG disintegrating tablet Take 1 tablet (4 mg total) by mouth every 8 (eight) hours as needed for nausea or vomiting. 30 tablet 0   traZODone (DESYREL) 50 MG tablet TAKE 1 TABLET BY MOUTH EVERYDAY AT BEDTIME 90 tablet 1   No current facility-administered medications for this visit.    Allergies  Allergen Reactions   Chantix [Varenicline Tartrate] Swelling    Lip swelling and rash   Penicillins Anaphylaxis    Family History  Problem Relation Age of Onset   Diabetes Mother    Hypertension Mother    Healthy Brother    Breast cancer Maternal Grandmother    Breast cancer Maternal Aunt    Ovarian cancer Maternal Aunt    Breast  cancer Maternal Aunt     Social History   Socioeconomic History   Marital status: Single    Spouse name: Not on file   Number of children: Not on file   Years of education: Not on file   Highest education level: Not on file  Occupational History   Not on file  Tobacco Use   Smoking status: Some Days    Types: E-cigarettes   Smokeless tobacco: Never   Tobacco comments:    only smokes E-Cigarettes  Vaping Use   Vaping Use: Every day  Substance and Sexual Activity   Alcohol use: Yes   Drug use: No   Sexual activity: Not on file  Other Topics Concern   Not on file  Social History Narrative   Not on file   Social Determinants of Health   Financial Resource Strain: Not on file  Food Insecurity: Not on file  Transportation Needs: Not on file  Physical Activity: Not on file  Stress: Not on file  Social Connections: Not on file  Intimate Partner Violence: Not on file     Constitutional: Patient reports headache, fever and chills.  Denies malaise, fatigue, or abrupt weight changes.  HEENT: Patient reports runny nose.  Denies eye pain, eye redness, ear pain, ringing in the ears, wax buildup, nasal congestion, bloody  nose, or sore throat. Respiratory: Denies difficulty breathing, shortness of breath, cough or sputum production.   Cardiovascular: Denies chest pain, chest tightness, palpitations or swelling in the hands or feet.  Gastrointestinal: Patient reports diarrhea.  Denies abdominal pain, bloating, constipation, or blood in the stool.  Musculoskeletal: Patient reports body aches.  Denies decrease in range of motion, difficulty with gait,  or joint pain and swelling.  Skin: Denies redness, rashes, lesions or ulcercations.   No other specific complaints in a complete review of systems (except as listed in HPI above).  Objective:   Physical Exam   BP 110/80 (BP Location: Left Arm, Patient Position: Sitting, Cuff Size: Large)   Pulse 90   Temp (!) 97.5 F (36.4 C)  (Temporal)   Wt 224 lb (101.6 kg)   SpO2 99%   BMI 39.68 kg/m   Wt Readings from Last 3 Encounters:  11/17/22 226 lb (102.5 kg)  10/13/22 232 lb (105.2 kg)  02/12/22 224 lb (101.6 kg)    General: Appears her stated age, appears unwell but in NAD. Skin: Warm, dry and intact. No rashes noted. HEENT: Head: normal shape and size, no sinus tenderness noted; Eyes: sclera white, no icterus, conjunctiva pink, PERRLA and EOMs intact;  Neck: No adenopathy noted. Cardiovascular: Normal rate and rhythm. S1,S2 noted.  No murmur, rubs or gallops noted.  Pulmonary/Chest: Normal effort and positive vesicular breath sounds. No respiratory distress. No wheezes, rales or ronchi noted.  Abdomen:  Normal bowel sounds.  Musculoskeletal:  No difficulty with gait.  Neurological: Alert and oriented.  BMET    Component Value Date/Time   NA 139 10/13/2022 0921   NA 142 04/06/2019 1554   NA 137 07/17/2014 1237   K 3.7 10/13/2022 0921   K 3.6 07/17/2014 1237   CL 103 10/13/2022 0921   CL 105 07/17/2014 1237   CO2 27 10/13/2022 0921   CO2 28 07/17/2014 1237   GLUCOSE 109 (H) 10/13/2022 0921   GLUCOSE 103 (H) 07/17/2014 1237   BUN 15 10/13/2022 0921   BUN 13 04/06/2019 1554   BUN 8 07/17/2014 1237   CREATININE 0.70 10/13/2022 0921   CALCIUM 9.1 10/13/2022 0921   CALCIUM 8.5 07/17/2014 1237   GFRNONAA 108 04/06/2019 1554   GFRNONAA >60 07/17/2014 1237   GFRAA 125 04/06/2019 1554   GFRAA >60 07/17/2014 1237    Lipid Panel     Component Value Date/Time   CHOL 181 10/13/2022 0921   TRIG 227 (H) 10/13/2022 0921   HDL 53 10/13/2022 0921   CHOLHDL 3.4 10/13/2022 0921   VLDL 68.4 (H) 03/13/2021 1036   LDLCALC 96 10/13/2022 0921    CBC    Component Value Date/Time   WBC 7.3 10/13/2022 0921   RBC 4.56 10/13/2022 0921   HGB 13.0 10/13/2022 0921   HGB 13.5 07/17/2014 1237   HCT 39.4 10/13/2022 0921   HCT 40.2 07/17/2014 1237   PLT 195 10/13/2022 0921   PLT 204 07/17/2014 1237   MCV 86.4  10/13/2022 0921   MCV 89 07/17/2014 1237   MCH 28.5 10/13/2022 0921   MCHC 33.0 10/13/2022 0921   RDW 13.1 10/13/2022 0921   RDW 13.3 07/17/2014 1237    Hgb A1C Lab Results  Component Value Date   HGBA1C 5.9 (H) 10/13/2022           Assessment & Plan:   Fever, Chills, Body Aches, Diarrhea:  Rapid flu negative Rapid COVID-negative Still likely viral in nature Encourage rest and  fluids Okay to take ibuprofen OTC Avoid antidiarrheals as this has a virus sheds itself out of your body Urgent care precautions discussed  RTC 2 months for follow-up of chronic conditions Webb Silversmith, NP

## 2023-02-04 NOTE — Patient Instructions (Signed)
Diarrhea, Adult Diarrhea is when you pass loose and sometimes watery poop (stool) often. Diarrhea can make you feel weak and cause you to lose water in your body (get dehydrated). Losing water in your body can cause you to: Feel tired and thirsty. Have a dry mouth. Go pee (urinate) less often. Diarrhea often lasts 2-3 days. It can last longer if it is a sign of something more serious. Be sure to treat your diarrhea as told by your doctor. Follow these instructions at home: Eating and drinking     Follow these instructions as told by your doctor: Take an ORS (oral rehydration solution). This is a drink that helps you replace fluids and minerals your body lost. It is sold at pharmacies and stores. Drink enough fluid to keep your pee (urine) pale yellow. Drink fluids such as: Water. You can also get fluids by sucking on ice chips. Diluted fruit juice. Low-calorie sports drinks. Milk. Avoid drinking fluids that have a lot of sugar or caffeine in them. These include soda, energy drinks, and regular sports drinks. Avoid alcohol. Eat bland, easy-to-digest foods in small amounts as you are able. These foods include: Bananas. Applesauce. Rice. Low-fat (lean) meats. Toast. Crackers. Avoid spicy or fatty foods.  Medicines Take over-the-counter and prescription medicines only as told by your doctor. If you were prescribed antibiotics, take them as told by your doctor. Do not stop taking them even if you start to feel better. General instructions  Wash your hands often using soap and water for 20 seconds. If soap and water are not available, use hand sanitizer. Others in your home should wash their hands as well. Wash your hands: After using the toilet or changing a diaper. Before preparing, cooking, or serving food. While caring for a sick person. While visiting someone in a hospital. Rest at home while you get better. Take a warm bath to help with any burning or pain from having  diarrhea. Watch your condition for any changes. Contact a doctor if: You have a fever. Your diarrhea gets worse. You have new symptoms. You vomit every time you eat or drink. You feel light-headed, dizzy, or you have a headache. You have muscle cramps. You have signs of losing too much water in your body, such as: Dark pee, very little pee, or no pee. Cracked lips. Dry mouth. Sunken eyes. Sleepiness. Weakness. You have bloody or black poop or poop that looks like tar. You have very bad pain, cramping, or bloating in your belly (abdomen). Your skin feels cold and clammy. You feel confused. Get help right away if: You have chest pain. Your heart is beating very quickly. You have trouble breathing or you are breathing very quickly. You feel very weak or you faint. These symptoms may be an emergency. Get help right away. Call 911. Do not wait to see if the symptoms will go away. Do not drive yourself to the hospital. This information is not intended to replace advice given to you by your health care provider. Make sure you discuss any questions you have with your health care provider. Document Revised: 06/02/2022 Document Reviewed: 06/02/2022 Elsevier Patient Education  [place].

## 2023-02-15 ENCOUNTER — Other Ambulatory Visit: Payer: Self-pay | Admitting: Internal Medicine

## 2023-02-16 NOTE — Telephone Encounter (Signed)
Requested Prescriptions  Pending Prescriptions Disp Refills   atorvastatin (LIPITOR) 10 MG tablet [Pharmacy Med Name: ATORVASTATIN 10 MG TABLET] 90 tablet 0    Sig: TAKE 1 TABLET BY MOUTH EVERY DAY     Cardiovascular:  Antilipid - Statins Failed - 02/15/2023  1:32 AM      Failed - Lipid Panel in normal range within the last 12 months    Cholesterol  Date Value Ref Range Status  10/13/2022 181 <200 mg/dL Final   LDL Cholesterol (Calc)  Date Value Ref Range Status  10/13/2022 96 mg/dL (calc) Final    Comment:    Reference range: <100 . Desirable range <100 mg/dL for primary prevention;   <70 mg/dL for patients with CHD or diabetic patients  with > or = 2 CHD risk factors. Marland Kitchen LDL-C is now calculated using the Martin-Hopkins  calculation, which is a validated novel method providing  better accuracy than the Friedewald equation in the  estimation of LDL-C.  Cresenciano Genre et al. Annamaria Helling. [id number]): 2061-2068  (http://education.[employer].com/faq/FAQ164)    Direct LDL  Date Value Ref Range Status  03/13/2021 153.0 mg/dL Final    Comment:    Optimal:  <100 mg/dLNear or Above Optimal:  100-129 mg/dLBorderline High:  130-159 mg/dLHigh:  160-189 mg/dLVery High:  >190 mg/dL   HDL  Date Value Ref Range Status  10/13/2022 53 > OR = 50 mg/dL Final   Triglycerides  Date Value Ref Range Status  10/13/2022 227 (H) <150 mg/dL Final    Comment:    . If a non-fasting specimen was collected, consider repeat triglyceride testing on a fasting specimen if clinically indicated.  Yates Decamp et al. J. of Clin. Lipidol. [id number]. Marland Kitchen          Passed - Patient is not pregnant      Passed - Valid encounter within last 12 months    Recent Outpatient Visits           1 week ago Viral gastroenteritis   [facility] [place], Coralie Keens, NP   3 months ago Viral gastroenteritis   [place], DO   4 months ago  Encounter for general adult medical examination with abnormal findings   [facility] [place], Coralie Keens, NP   1 year ago Cyst of left ovary   [facility] [place], Coralie Keens, NP   1 year ago [facility] [place], Coralie Keens, [place]

## 2023-03-10 ENCOUNTER — Other Ambulatory Visit: Payer: Self-pay | Admitting: Internal Medicine

## 2023-03-11 NOTE — Telephone Encounter (Signed)
Requested medication (s) are due for refill today -yes  Requested medication (s) are on the active medication list -yes  Future visit scheduled -no  Last refill: 11/09/22 #30  Notes to clinic: non delegated Rx  Requested Prescriptions  Pending Prescriptions Disp Refills   clonazePAM (KLONOPIN) 0.5 MG tablet [Pharmacy Med Name: CLONAZEPAM 0.5 MG TABLET] 30 tablet 0    Sig: TAKE 1 TABLET BY MOUTH EVERY DAY AS NEEDED FOR ANXIETY     Not Delegated - Psychiatry: Anxiolytics/Hypnotics 2 Failed - 03/10/2023 11:13 AM      Failed - This refill cannot be delegated      Failed - Urine Drug Screen completed in last 360 days      Passed - Patient is not pregnant      Passed - Valid encounter within last 6 months    Recent Outpatient Visits           1 month ago Viral gastroenteritis   [facility] [place], Coralie Keens, NP   3 months ago Viral gastroenteritis   [place], DO   4 months ago Encounter for general adult medical examination with abnormal findings   [facility] [place], Coralie Keens, NP   1 year ago Cyst of left ovary   [facility] [place], Coralie Keens, NP   1 year ago [facility] [place], Coralie Keens, NP                 Requested Prescriptions  Pending Prescriptions Disp Refills   clonazePAM (KLONOPIN) 0.5 MG tablet [Pharmacy Med Name: CLONAZEPAM 0.5 MG TABLET] 30 tablet 0    Sig: TAKE 1 TABLET BY MOUTH EVERY DAY AS NEEDED FOR ANXIETY     Not Delegated - Psychiatry: Anxiolytics/Hypnotics 2 Failed - 03/10/2023 11:13 AM      Failed - This refill cannot be delegated      Failed - Urine Drug Screen completed in last 360 days      Passed - Patient is not pregnant      Passed - Valid encounter within last 6 months    Recent Outpatient Visits           1 month ago Viral gastroenteritis   [facility] [place], Coralie Keens, NP   3 months ago Viral gastroenteritis   [place], DO   4 months ago Encounter for general adult medical examination with abnormal findings   [facility] [place], Coralie Keens, NP   1 year ago Cyst of left ovary   [facility] [place], Coralie Keens, NP   1 year ago [facility] [place], Coralie Keens, [place]

## 2023-04-02 ENCOUNTER — Other Ambulatory Visit: Payer: Self-pay | Admitting: Internal Medicine

## 2023-04-05 NOTE — Telephone Encounter (Signed)
Requested Prescriptions  Pending Prescriptions Disp Refills   traZODone (DESYREL) 50 MG tablet [Pharmacy Med Name: TRAZODONE 50 MG TABLET] 90 tablet 0    Sig: TAKE 1 TABLET BY MOUTH EVERYDAY AT BEDTIME     Psychiatry: Antidepressants - Serotonin Modulator Passed - 04/02/2023 10:28 PM      Passed - Completed PHQ-2 or PHQ-9 in the last 360 days      Passed - Valid encounter within last 6 months    Recent Outpatient Visits           2 months ago Viral gastroenteritis   [place] [facility] [place], Salvadore [place], NP   4 months ago Viral gastroenteritis   [place] [facility] [place], Netta Neat, DO   5 months ago Encounter for general adult medical examination with abnormal findings   [place] [facility] [place], Salvadore [place], NP   1 year ago Cyst of left ovary   [place] [facility] [place], Salvadore Oxford, NP   1 year ago Psoriasis   [place] [facility] [place], Salvadore Oxford, [place]

## 2023-04-15 ENCOUNTER — Other Ambulatory Visit: Payer: Self-pay | Admitting: Internal Medicine

## 2023-04-15 NOTE — Telephone Encounter (Signed)
Requested Prescriptions  Pending Prescriptions Disp Refills   amLODipine (NORVASC) 5 MG tablet [Pharmacy Med Name: AMLODIPINE BESYLATE 5 MG TAB] 90 tablet 0    Sig: TAKE 1 TABLET (5 MG TOTAL) BY MOUTH DAILY. OFFICE VISIT NEEDED FOR ADDITIONAL REFILLS     Cardiovascular: Calcium Channel Blockers 2 Passed - 04/15/2023  1:45 AM      Passed - Last BP in normal range    BP Readings from Last 1 Encounters:  02/04/23 110/80         Passed - Last Heart Rate in normal range    Pulse Readings from Last 1 Encounters:  02/04/23 90         Passed - Valid encounter within last 6 months    Recent Outpatient Visits           2 months ago Viral gastroenteritis   [place] [facility] [place], Salvadore Oxford, NP   4 months ago Viral gastroenteritis   [facility] [facility] [place], Netta Neat, DO   6 months ago Encounter for general adult medical examination with abnormal findings   [place] [facility] [place], Salvadore Oxford, NP   1 year ago Cyst of left ovary   [place] [facility] [place], Salvadore Oxford, NP   1 year ago Psoriasis   [place] [facility] [place], Salvadore Oxford, [place]

## 2023-05-15 ENCOUNTER — Other Ambulatory Visit: Payer: Self-pay | Admitting: Internal Medicine

## 2023-05-17 ENCOUNTER — Encounter: Payer: Self-pay | Admitting: Internal Medicine

## 2023-05-17 NOTE — Telephone Encounter (Signed)
Requested medication (s) are due for refill today:   Provider to review  Requested medication (s) are on the active medication list:   Yes  Future visit scheduled:   No   Last ordered: 03/11/2023 #30, 0 refills  Non delegated refill    Requested Prescriptions  Pending Prescriptions Disp Refills   clonazePAM (KLONOPIN) 0.5 MG tablet [Pharmacy Med Name: CLONAZEPAM 0.5 MG TABLET] 30 tablet 0    Sig: TAKE 1 TABLET BY MOUTH EVERY DAY AS NEEDED FOR ANXIETY     Not Delegated - Psychiatry: Anxiolytics/Hypnotics 2 Failed - 05/15/2023  2:24 PM      Failed - This refill cannot be delegated      Failed - Urine Drug Screen completed in last 360 days      Passed - Patient is not pregnant      Passed - Valid encounter within last 6 months    Recent Outpatient Visits           3 months ago Viral gastroenteritis   [place] [facility] [place], Salvadore Oxford, NP   6 months ago Viral gastroenteritis   [place] [facility] [place], Netta Neat, DO   7 months ago Encounter for general adult medical examination with abnormal findings   [place] [facility] [place], Salvadore Oxford, NP   1 year ago Cyst of left ovary   [facility] [place], Salvadore Oxford, NP   1 year ago Psoriasis    [facility] [place], Salvadore Oxford, [place]

## 2023-06-22 ENCOUNTER — Encounter: Payer: Self-pay | Admitting: Internal Medicine

## 2023-06-22 ENCOUNTER — Ambulatory Visit: Payer: BLUE CROSS/BLUE SHIELD | Admitting: Internal Medicine

## 2023-06-22 VITALS — BP 126/86 | HR 92 | Temp 96.8°F | Wt 223.0 lb

## 2023-06-22 DIAGNOSIS — F419 Anxiety disorder, unspecified: Secondary | ICD-10-CM

## 2023-06-22 DIAGNOSIS — R7303 Prediabetes: Secondary | ICD-10-CM | POA: Diagnosis not present

## 2023-06-22 DIAGNOSIS — L409 Psoriasis, unspecified: Secondary | ICD-10-CM

## 2023-06-22 DIAGNOSIS — Z6839 Body mass index (BMI) 39.0-39.9, adult: Secondary | ICD-10-CM

## 2023-06-22 DIAGNOSIS — F5101 Primary insomnia: Secondary | ICD-10-CM

## 2023-06-22 DIAGNOSIS — J0100 Acute maxillary sinusitis, unspecified: Secondary | ICD-10-CM

## 2023-06-22 DIAGNOSIS — E782 Mixed hyperlipidemia: Secondary | ICD-10-CM | POA: Diagnosis not present

## 2023-06-22 DIAGNOSIS — I10 Essential (primary) hypertension: Secondary | ICD-10-CM

## 2023-06-22 DIAGNOSIS — H66001 Acute suppurative otitis media without spontaneous rupture of ear drum, right ear: Secondary | ICD-10-CM

## 2023-06-22 DIAGNOSIS — F32A Depression, unspecified: Secondary | ICD-10-CM

## 2023-06-22 LAB — CBC: MCHC: 33.1 g/dL (ref 32.0–36.0)

## 2023-06-22 MED ORDER — DOXYCYCLINE HYCLATE 100 MG PO TABS: 100.0000 mg | ORAL_TABLET | Freq: Two times a day (BID) | ORAL | 0 refills | Status: DC

## 2023-06-22 MED ORDER — TRAZODONE HCL 50 MG PO TABS: ORAL_TABLET | ORAL | 1 refills | Status: DC

## 2023-06-22 NOTE — Assessment & Plan Note (Signed)
Stable on clonazepam as needed Support offered

## 2023-06-22 NOTE — Progress Notes (Signed)
Subjective:    Patient ID: Jasmine Miller, female    DOB: [birth year], 47 y.o.   MRN: [id number]  HPI  Patient presents to clinic today for follow-up of chronic conditions.  HTN: Her BP today is 136/86.  She is taking losartan and amlodipine as prescribed.  ECG from 05/2016 reviewed.  Psoriasis: Managed with humira.  She follows with dermatology.  Anxiety and depression: Chronic, managed on clonazepam as needed.  She is not currently seeing a therapist.  She denies SI/HI.  Insomnia: She has difficulty falling asleep.  She is taking trazodone as prescribed good relief of symptoms.  There is no sleep study on file.  HLD: Her last LDL was 96, triglycerides 782, 09/2022.  She is not taking atorvastatin as prescribed.  She tries to consume a low-fat diet.  Prediabetes: Her last A1c was 5.9%, 09/2022.  She is not taking any oral diabetic medication at this time.  She does not check her sugars.  She also complains of headache, nasal congestion, right ear pain, sore throat and cough.  This started 5 days ago.  She is not blowing any mucus out of her nose.  She describes the ear pain as sharp and stabbing.  She has not had any drainage from the ear.  She is having difficulty swallowing.  The cough is mostly nonproductive.  She denies runny nose, shortness of breath, chest pain, nausea, vomiting or diarrhea.  She denies fever, chills or body aches.  She has tried antihistamine and mucinex with minimal relief of symptoms. She has had sick contacts.  Review of Systems     Past Medical History:  Diagnosis Date   Hypertension     Current Outpatient Medications  Medication Sig Dispense Refill   amLODipine (NORVASC) 5 MG tablet TAKE 1 TABLET (5 MG TOTAL) BY MOUTH DAILY. OFFICE VISIT NEEDED FOR ADDITIONAL REFILLS 90 tablet 0   atorvastatin (LIPITOR) 10 MG tablet TAKE 1 TABLET BY MOUTH EVERY DAY 90 tablet 0   clonazePAM (KLONOPIN) 0.5 MG tablet TAKE 1 TABLET BY MOUTH EVERY DAY AS NEEDED FOR  ANXIETY 30 tablet 0   ENBREL SURECLICK 50 MG/ML injection Inject into the skin once a week.     fluticasone (FLONASE) 50 MCG/ACT nasal spray Place 2 sprays into both nostrils daily. Use for 4-6 weeks then stop and use seasonally or as needed. 16 g 3   losartan (COZAAR) 100 MG tablet TAKE 1 TABLET BY MOUTH EVERY DAY 90 tablet 1   ondansetron (ZOFRAN-ODT) 4 MG disintegrating tablet Take 1 tablet (4 mg total) by mouth every 8 (eight) hours as needed for nausea or vomiting. (Patient not taking: Reported on 02/04/2023) 30 tablet 0   traZODone (DESYREL) 50 MG tablet TAKE 1 TABLET BY MOUTH EVERYDAY AT BEDTIME 90 tablet 0   No current facility-administered medications for this visit.    Allergies  Allergen Reactions   Chantix [Varenicline Tartrate] Swelling    Lip swelling and rash   Penicillins Anaphylaxis    Family History  Problem Relation Age of Onset   Diabetes Mother    Hypertension Mother    Healthy Brother    Breast cancer Maternal Grandmother    Breast cancer Maternal Aunt    Ovarian cancer Maternal Aunt    Breast cancer Maternal Aunt     Social History   Socioeconomic History   Marital status: Single    Spouse name: Not on file   Number of children: Not on file   Years of education:  Not on file   Highest education level: Not on file  Occupational History   Not on file  Tobacco Use   Smoking status: Some Days    Types: E-cigarettes   Smokeless tobacco: Never   Tobacco comments:    only smokes E-Cigarettes  Vaping Use   Vaping Use: Every day  Substance and Sexual Activity   Alcohol use: Yes   Drug use: No   Sexual activity: Not on file  Other Topics Concern   Not on file  Social History Narrative   Not on file   Social Determinants of Health   Financial Resource Strain: Not on file  Food Insecurity: Not on file  Transportation Needs: Not on file  Physical Activity: Not on file  Stress: Not on file  Social Connections: Not on file  Intimate Partner  Violence: Not on file     Constitutional: Patient reports headache.  Denies fever, malaise, fatigue, or abrupt weight changes.  HEENT: Patient reports ear pain, nasal congestion and sore throat.  Denies eye pain, eye redness, ringing in the ears, wax buildup, runny nose, bloody nose. Respiratory: Patient reports cough.  Denies difficulty breathing, shortness of breath,  or sputum production.   Cardiovascular: Denies chest pain, chest tightness, palpitations or swelling in the hands or feet.  Gastrointestinal: Denies abdominal pain, bloating, constipation, diarrhea or blood in the stool.  GU: Denies urgency, frequency, pain with urination, burning sensation, blood in urine, odor or discharge. Musculoskeletal: Denies decrease in range of motion, difficulty with gait, muscle pain or joint pain and swelling.  Skin: Patient reports psoriasis patches.  Denies redness, rashes, lesions or ulcercations.  Neurological: Patient reports insomnia.  Denies dizziness, difficulty with memory, difficulty with speech or problems with balance and coordination.  Psych: Patient is a history of anxiety and depression.  Denies SI/HI.  No other specific complaints in a complete review of systems (except as listed in HPI above).  Objective:   Physical Exam  BP 126/86 (BP Location: Left Arm, Patient Position: Sitting, Cuff Size: Large)   Pulse 92   Temp (!) 96.8 F (36 C) (Temporal)   Wt 223 lb (101.2 kg)   SpO2 97%   BMI 39.50 kg/m   Wt Readings from Last 3 Encounters:  02/04/23 224 lb (101.6 kg)  11/17/22 226 lb (102.5 kg)  10/13/22 232 lb (105.2 kg)    General: Appears her stated age, obese in NAD. Skin: Warm, dry and intact.  HEENT: Head: normal shape and size, maxillary sinus tenderness noted; Eyes: sclera white, no icterus, conjunctiva pink, PERRLA and EOMs intact; Right ears: Tm's red and bulging, distorted light reflex; Throat/Mouth: Teeth present, mucosa erythematous and moist, no exudate,  lesions or ulcerations noted.  Neck: No adenopathy noted. Cardiovascular: Normal rate and rhythm. S1,S2 noted.  No murmur, rubs or gallops noted. No JVD or BLE edema.  Pulmonary/Chest: Normal effort and positive vesicular breath sounds. No respiratory distress. No wheezes, rales or ronchi noted.  Musculoskeletal: No difficulty with gait.  Neurological: Alert and oriented.  Coordination normal.  Psychiatric: Mood and affect flat. Behavior is normal. Judgment and thought content normal.     BMET    Component Value Date/Time   NA 139 10/13/2022 0921   NA 142 04/06/2019 1554   NA 137 07/17/2014 1237   K 3.7 10/13/2022 0921   K 3.6 07/17/2014 1237   CL 103 10/13/2022 0921   CL 105 07/17/2014 1237   CO2 27 10/13/2022 0921   CO2 28  07/17/2014 1237   GLUCOSE 109 (H) 10/13/2022 0921   GLUCOSE 103 (H) 07/17/2014 1237   BUN 15 10/13/2022 0921   BUN 13 04/06/2019 1554   BUN 8 07/17/2014 1237   CREATININE 0.70 10/13/2022 0921   CALCIUM 9.1 10/13/2022 0921   CALCIUM 8.5 07/17/2014 1237   GFRNONAA 108 04/06/2019 1554   GFRNONAA >60 07/17/2014 1237   GFRAA 125 04/06/2019 1554   GFRAA >60 07/17/2014 1237    Lipid Panel     Component Value Date/Time   CHOL 181 10/13/2022 0921   TRIG 227 (H) 10/13/2022 0921   HDL 53 10/13/2022 0921   CHOLHDL 3.4 10/13/2022 0921   VLDL 68.4 (H) 03/13/2021 1036   LDLCALC 96 10/13/2022 0921    CBC    Component Value Date/Time   WBC 7.3 10/13/2022 0921   RBC 4.56 10/13/2022 0921   HGB 13.0 10/13/2022 0921   HGB 13.5 07/17/2014 1237   HCT 39.4 10/13/2022 0921   HCT 40.2 07/17/2014 1237   PLT 195 10/13/2022 0921   PLT 204 07/17/2014 1237   MCV 86.4 10/13/2022 0921   MCV 89 07/17/2014 1237   MCH 28.5 10/13/2022 0921   MCHC 33.0 10/13/2022 0921   RDW 13.1 10/13/2022 0921   RDW 13.3 07/17/2014 1237    Hgb A1C Lab Results  Component Value Date   HGBA1C 5.9 (H) 10/13/2022            Assessment & Plan:   Right Otitis Media, Acute  Maxillary Sinusitis:  Recommend she use her Flonase She can use a neti pot which can be purchased from her local pharmacy Rx for doxycycline 100 mg twice daily x 10 days  RTC in 4 months for annual exam Nicki Reaper, NP

## 2023-06-22 NOTE — Assessment & Plan Note (Signed)
Encourage diet and exercise for weight loss 

## 2023-06-22 NOTE — Assessment & Plan Note (Signed)
Continue Humira per dermatology 

## 2023-06-22 NOTE — Assessment & Plan Note (Signed)
Continue trazodone We will monitor 

## 2023-06-22 NOTE — Assessment & Plan Note (Signed)
Controlled on amlodipine and losartan Reinforced DASH diet and exercise for weight loss C-Met today

## 2023-06-22 NOTE — Assessment & Plan Note (Signed)
A1c today Encourage low-carb diet and exercise for weight loss 

## 2023-06-22 NOTE — Patient Instructions (Signed)

## 2023-06-22 NOTE — Assessment & Plan Note (Signed)
C-Met and lipid profile today Encouraged her to consume a low-fat diet Not taking atorvastatin as prescribed

## 2023-06-23 ENCOUNTER — Encounter: Payer: Self-pay | Admitting: Internal Medicine

## 2023-06-23 DIAGNOSIS — E782 Mixed hyperlipidemia: Secondary | ICD-10-CM

## 2023-06-23 LAB — CBC
HCT: 40.2 % (ref 35.0–45.0)
Hemoglobin: 13.3 g/dL (ref 11.7–15.5)
MCH: 28.1 pg (ref 27.0–33.0)
MCV: 84.8 fL (ref 80.0–100.0)
MPV: 10.3 fL (ref 7.5–12.5)
Platelets: 200 10*3/uL (ref 140–400)
RBC: 4.74 10*6/uL (ref 3.80–5.10)
RDW: 13.1 % (ref 11.0–15.0)
WBC: 7.9 10*3/uL (ref 3.8–10.8)

## 2023-06-23 LAB — COMPLETE METABOLIC PANEL WITH GFR
AG Ratio: 1.2 (calc) (ref 1.0–2.5)
ALT: 19 U/L (ref 6–29)
AST: 14 U/L (ref 10–35)
Albumin: 4 g/dL (ref 3.6–5.1)
Alkaline phosphatase (APISO): 71 U/L (ref 31–125)
BUN: 14 mg/dL (ref 7–25)
CO2: 30 mmol/L (ref 20–32)
Calcium: 9.2 mg/dL (ref 8.6–10.2)
Chloride: 102 mmol/L (ref 98–110)
Creat: 0.65 mg/dL (ref 0.50–0.99)
Globulin: 3.3 g/dL (calc) (ref 1.9–3.7)
Glucose, Bld: 127 mg/dL (ref 65–139)
Potassium: 4 mmol/L (ref 3.5–5.3)
Sodium: 139 mmol/L (ref 135–146)
Total Bilirubin: 0.3 mg/dL (ref 0.2–1.2)
Total Protein: 7.3 g/dL (ref 6.1–8.1)
eGFR: 110 mL/min/{1.73_m2} (ref >=60)

## 2023-06-23 LAB — LIPID PANEL
Cholesterol: 234 mg/dL — ABNORMAL HIGH (ref <200)
HDL: 47 mg/dL — ABNORMAL LOW (ref >=50)
LDL Cholesterol (Calc): 148 mg/dL (calc) — ABNORMAL HIGH
Non-HDL Cholesterol (Calc): 187 mg/dL (calc) — ABNORMAL HIGH (ref <130)
Total CHOL/HDL Ratio: 5 (calc) — ABNORMAL HIGH (ref <5.0)
Triglycerides: 240 mg/dL — ABNORMAL HIGH (ref <150)

## 2023-06-23 LAB — HEMOGLOBIN A1C
Hgb A1c MFr Bld: 6.1 % of total Hgb — ABNORMAL HIGH (ref <5.7)
Mean Plasma Glucose: 128 mg/dL
eAG (mmol/L): 7.1 mmol/L

## 2023-06-24 ENCOUNTER — Other Ambulatory Visit: Payer: Self-pay | Admitting: Internal Medicine

## 2023-06-24 MED ORDER — NEXLETOL 180 MG PO TABS: 1.0000 | ORAL_TABLET | Freq: Every day | ORAL | 1 refills | Status: DC

## 2023-06-24 MED ORDER — BENZONATATE 200 MG PO CAPS: 200.0000 mg | ORAL_CAPSULE | Freq: Three times a day (TID) | ORAL | 0 refills | Status: DC | PRN

## 2023-06-24 NOTE — Telephone Encounter (Signed)
Requested medication (s) are due for refill today - no  Requested medication (s) are on the active medication list -yes  Future visit scheduled -yes  Last refill: 06/24/23 #90 1RF  Notes to clinic: Pharmacy request: Prior authorization needed  Requested Prescriptions  Pending Prescriptions Disp Refills   NEXLETOL 180 MG TABS [Pharmacy Med Name: NEXLETOL 180 MG TABLET] 90 tablet 1    Sig: Take 1 tablet (180 mg total) by mouth daily at 12 noon.     Off-Protocol Failed - 06/24/2023 10:46 AM      Failed - Medication not assigned to a protocol, review manually.      Passed - Valid encounter within last 12 months    Recent Outpatient Visits           2 days ago Prediabetes   [place] [facility] [place], Salvadore Oxford, NP   4 months ago Viral gastroenteritis   [place] [facility] [place], Salvadore Oxford, NP   7 months ago Viral gastroenteritis   [place] [facility] Smitty Cords, DO   8 months ago Encounter for general adult medical examination with abnormal findings   [place] [facility] [place], Salvadore Oxford, NP   1 year ago Cyst of left ovary   [place] [facility] [place], Salvadore Oxford, NP       Future Appointments             In 3 months [place], Salvadore Oxford, NP [place] [facility], [facility]               Requested Prescriptions  Pending Prescriptions Disp Refills   NEXLETOL 180 MG TABS [Pharmacy Med Name: NEXLETOL 180 MG TABLET] 90 tablet 1    Sig: Take 1 tablet (180 mg total) by mouth daily at 12 noon.     Off-Protocol Failed - 06/24/2023 10:46 AM      Failed - Medication not assigned to a protocol, review manually.      Passed - Valid encounter within last 12 months    Recent Outpatient Visits           2 days ago Prediabetes   [place] [facility] [place], Salvadore Oxford, NP   4 months ago Viral gastroenteritis   [place] [facility] [place], Salvadore Oxford, NP   7 months ago Viral gastroenteritis   [place] [facility] Smitty Cords, DO   8 months ago Encounter for general adult medical examination with abnormal findings   [place] [facility] [place], Salvadore Oxford, NP   1 year ago Cyst of left ovary   [place] [facility] [place], Salvadore Oxford, NP       Future Appointments             In 3 months [place], Salvadore Oxford, NP [place] [facility], [facility]

## 2023-06-24 NOTE — Telephone Encounter (Signed)
That is fine 

## 2023-06-25 NOTE — Telephone Encounter (Signed)
Can we do PA, She has failed simvastatin and atorvastatin

## 2023-06-28 NOTE — Telephone Encounter (Signed)
PA has been submitted to Cover My Meds.   Thanks,   -Jari Dipasquale  

## 2023-07-13 NOTE — Telephone Encounter (Signed)
Insurance denied Nexletol,  I have submitted an appeal to her insurance.   Thanks,   -Vernona Rieger

## 2023-07-15 NOTE — Telephone Encounter (Signed)
Nexletol has been approved.    Thanks,   -Vernona Rieger

## 2023-07-17 ENCOUNTER — Other Ambulatory Visit: Payer: Self-pay | Admitting: Internal Medicine

## 2023-07-17 DIAGNOSIS — I10 Essential (primary) hypertension: Secondary | ICD-10-CM

## 2023-07-18 ENCOUNTER — Other Ambulatory Visit: Payer: Self-pay | Admitting: Internal Medicine

## 2023-07-19 NOTE — Telephone Encounter (Signed)
Requested Prescriptions  Pending Prescriptions Disp Refills   losartan (COZAAR) 100 MG tablet [Pharmacy Med Name: LOSARTAN POTASSIUM 100 MG TAB] 90 tablet 1    Sig: TAKE 1 TABLET BY MOUTH EVERY DAY     Cardiovascular:  Angiotensin Receptor Blockers Passed - 07/17/2023  9:19 AM      Passed - Cr in normal range and within 180 days    Creat  Date Value Ref Range Status  06/22/2023 0.65 0.50 - 0.99 mg/dL Final         Passed - K in normal range and within 180 days    Potassium  Date Value Ref Range Status  06/22/2023 4.0 3.5 - 5.3 mmol/L Final  07/17/2014 3.6 3.5 - 5.1 mmol/L Final         Passed - Patient is not pregnant      Passed - Last BP in normal range    BP Readings from Last 1 Encounters:  06/22/23 126/86         Passed - Valid encounter within last 6 months    Recent Outpatient Visits           3 weeks ago Prediabetes   [place] [facility] [place], Salvadore Oxford, NP   5 months ago Viral gastroenteritis   [place] [facility] [place], Salvadore Oxford, NP   8 months ago Viral gastroenteritis   [place] [facility] [place], Netta Neat, DO   9 months ago Encounter for general adult medical examination with abnormal findings   [place] [facility] [place], Salvadore Oxford, NP   1 year ago Cyst of left ovary   [place] [facility] [place], Salvadore Oxford, NP       Future Appointments             In 3 months Baity, Salvadore Oxford, NP  [facility], [facility]

## 2023-07-20 NOTE — Telephone Encounter (Signed)
Requested Prescriptions  Pending Prescriptions Disp Refills   amLODipine (NORVASC) 5 MG tablet [Pharmacy Med Name: AMLODIPINE BESYLATE 5 MG TAB] 90 tablet 1    Sig: TAKE 1 TABLET (5 MG TOTAL) BY MOUTH DAILY. OFFICE VISIT NEEDED FOR ADDITIONAL REFILLS     Cardiovascular: Calcium Channel Blockers 2 Passed - 07/18/2023  9:50 AM      Passed - Last BP in normal range    BP Readings from Last 1 Encounters:  06/22/23 126/86         Passed - Last Heart Rate in normal range    Pulse Readings from Last 1 Encounters:  06/22/23 92         Passed - Valid encounter within last 6 months    Recent Outpatient Visits           4 weeks ago Prediabetes   [place] [facility] [place], Salvadore Oxford, NP   5 months ago Viral gastroenteritis   [place] [facility] [place], Salvadore Oxford, NP   8 months ago Viral gastroenteritis   [place] [facility] [place], Netta Neat, DO   9 months ago Encounter for general adult medical examination with abnormal findings   [place] [facility] [place], Salvadore Oxford, NP   1 year ago Cyst of left ovary   [place] [facility] [place], Salvadore Oxford, NP       Future Appointments             In 3 months Baity, Salvadore Oxford, NP  [facility], [facility]

## 2023-08-02 ENCOUNTER — Encounter: Payer: Self-pay | Admitting: Physician Assistant

## 2023-08-02 ENCOUNTER — Ambulatory Visit: Payer: BLUE CROSS/BLUE SHIELD | Admitting: Physician Assistant

## 2023-08-02 ENCOUNTER — Ambulatory Visit: Payer: Self-pay

## 2023-08-02 VITALS — BP 139/82 | HR 92 | Wt 221.2 lb

## 2023-08-02 DIAGNOSIS — R52 Pain, unspecified: Secondary | ICD-10-CM | POA: Diagnosis not present

## 2023-08-02 DIAGNOSIS — J029 Acute pharyngitis, unspecified: Secondary | ICD-10-CM | POA: Diagnosis not present

## 2023-08-02 DIAGNOSIS — J069 Acute upper respiratory infection, unspecified: Secondary | ICD-10-CM | POA: Diagnosis not present

## 2023-08-02 LAB — RAPID STREP SCREEN (MED CTR MEBANE ONLY): Strep Gp A Ag, IA W/Reflex: NEGATIVE

## 2023-08-02 MED ORDER — PREDNISONE 20 MG PO TABS
ORAL_TABLET | ORAL | 0 refills | Status: DC
Start: 2023-08-02 — End: 2023-10-20

## 2023-08-02 NOTE — Telephone Encounter (Signed)
Pt stated has a sore throat, lymph nodes swollen, pain when swallowing and a headache. Pt mentioned this has been going on since Friday.    No appointments.   Seeking clinical advice.    Chief Complaint: Sore throat, swollen lymph nodes, headache Symptoms: Above Frequency: Friday Pertinent Negatives: Patient denies fever Disposition: [] ED /[] Urgent Care (no appt availability in office) / [x] Appointment(In office/virtual)/ []  [facility]/ [] Home Care/ [] Refused Recommended Disposition /[] [place] [facility]/ []  Follow-up with PCP Additional Notes: Pt. Agrees with appointment .  Reason for Disposition  SEVERE (e.g., excruciating) throat pain  Answer Assessment - Initial Assessment Questions 1. ONSET: "When did the throat start hurting?" (Hours or days ago)      Friday 2. SEVERITY: "How bad is the sore throat?" (Scale 1-10; mild, moderate or severe)   - MILD (1-3):  Doesn't interfere with eating or normal activities.   - MODERATE (4-7): Interferes with eating some solids and normal activities.   - SEVERE (8-10):  Excruciating pain, interferes with most normal activities.   - SEVERE WITH DYSPHAGIA (10): Can't swallow liquids, drooling.     Moderate 3. STREP EXPOSURE: "Has there been any exposure to strep within the past week?" If Yes, ask: "What type of contact occurred?"      No 4.  VIRAL SYMPTOMS: "Are there any symptoms of a cold, such as a runny nose, cough, hoarse voice or red eyes?"      Headache, lymph nodes 5. FEVER: "Do you have a fever?" If Yes, ask: "What is your temperature, how was it measured, and when did it start?"     No 6. PUS ON THE TONSILS: "Is there pus on the tonsils in the back of your throat?"     No 7. OTHER SYMPTOMS: "Do you have any other symptoms?" (e.g., difficulty breathing, headache, rash)     Headache 8. PREGNANCY: "Is there any chance you are pregnant?" "When was your last menstrual period?"     No  Protocols used: Sore  Throat-A-AH

## 2023-08-02 NOTE — Progress Notes (Signed)
Acute Office Visit   Patient: Jasmine Miller   DOB: Nov 29, 1976   47 y.o. Female  MRN: [id number] Visit Date: 08/02/2023  Today's healthcare provider: Oswaldo Conroy Nassim Cosma, PA-C  Introduced myself to the patient as a Secondary school teacher and provided education on APPs in clinical practice.    Chief Complaint  Patient presents with   Sore Throat    Patient says her lymph nodes are pretty swollen and said the L side is pretty swollen and she feels it is rubbing against her uvula. Patient says she has not taken any other medications other than Tylenol. Patient says she does have people sick at work. Patient says her symptoms started on Friday. Patient says she feels she is feeling worse since Friday.    Night Sweats   Fatigue   Fever   Subjective    HPI HPI     Sore Throat    Additional comments: Patient says her lymph nodes are pretty swollen and said the L side is pretty swollen and she feels it is rubbing against her uvula. Patient says she has not taken any other medications other than Tylenol. Patient says she does have people sick at work. Patient says her symptoms started on Friday. Patient says she feels she is feeling worse since Friday.       Last edited by Malen Gauze, CMA on 08/02/2023  3:51 PM.        Concern for Sore throat  Onset: gradual  Duration: ongoing since Friday  Associated symptoms: sore throat, concern for adenopathy, achy, fatigue, headaches  She denies productive coughing SOB, nasal congestion or runny nose  Interventions: Tylenol   She reports several people at work are sick but she is not sure what they have She denies recent travel  She has not tested for COVID with a home test She states she is not interested in COVID antiviral medications at this time    Medications: Outpatient Medications Prior to Visit  Medication Sig   amLODipine (NORVASC) 5 MG tablet TAKE 1 TABLET (5 MG TOTAL) BY MOUTH DAILY. OFFICE VISIT NEEDED FOR ADDITIONAL REFILLS    atorvastatin (LIPITOR) 10 MG tablet TAKE 1 TABLET BY MOUTH EVERY DAY   clonazePAM (KLONOPIN) 0.5 MG tablet TAKE 1 TABLET BY MOUTH EVERY DAY AS NEEDED FOR ANXIETY   fluticasone (FLONASE) 50 MCG/ACT nasal spray Place 2 sprays into both nostrils daily. Use for 4-6 weeks then stop and use seasonally or as needed.   HUMIRA, 2 PEN, 40 MG/0.4ML pen SMARTSIG:40 Milligram(s) SUB-Q Every 2 Weeks   losartan (COZAAR) 100 MG tablet TAKE 1 TABLET BY MOUTH EVERY DAY   traZODone (DESYREL) 50 MG tablet TAKE 1 TABLET BY MOUTH EVERYDAY AT BEDTIME   Bempedoic Acid (NEXLETOL) 180 MG TABS Take 1 tablet (180 mg total) by mouth daily at 12 noon. (Patient not taking: Reported on 08/02/2023)   benzonatate (TESSALON) 200 MG capsule Take 1 capsule (200 mg total) by mouth 3 (three) times daily as needed. (Patient not taking: Reported on 08/02/2023)   doxycycline (VIBRA-TABS) 100 MG tablet Take 1 tablet (100 mg total) by mouth 2 (two) times daily. (Patient not taking: Reported on 08/02/2023)   ondansetron (ZOFRAN-ODT) 4 MG disintegrating tablet Take 1 tablet (4 mg total) by mouth every 8 (eight) hours as needed for nausea or vomiting. (Patient not taking: Reported on 08/02/2023)   No facility-administered medications prior to visit.    Review of Systems  Constitutional:  Positive  for diaphoresis, fatigue and fever.  HENT:  Positive for postnasal drip and sore throat. Negative for congestion, rhinorrhea and trouble swallowing.   Respiratory:  Positive for cough. Negative for shortness of breath and wheezing.   Gastrointestinal:  Positive for diarrhea. Negative for nausea and vomiting.  Musculoskeletal:  Positive for myalgias.  Neurological:  Positive for headaches.  Hematological:  Positive for adenopathy.        Objective    BP 139/82   Pulse 92   Wt 221 lb 3.2 oz (100.3 kg)   SpO2 98%   BMI 39.18 kg/m     Physical Exam Vitals reviewed.  Constitutional:      Appearance: She is well-developed.  HENT:     Head:  Normocephalic and atraumatic.     Mouth/Throat:     Lips: Pink.     Mouth: Mucous membranes are moist.     Pharynx: Uvula midline. Pharyngeal swelling and posterior oropharyngeal erythema present. No oropharyngeal exudate or uvula swelling.     Tonsils: No tonsillar exudate or tonsillar abscesses. 2+ on the right. 2+ on the left.  Cardiovascular:     Rate and Rhythm: Normal rate and regular rhythm.     Pulses: Normal pulses.          Radial pulses are 2+ on the right side and 2+ on the left side.     Heart sounds: Normal heart sounds.  Pulmonary:     Effort: Pulmonary effort is normal.     Breath sounds: Normal breath sounds. No decreased air movement. No decreased breath sounds, wheezing, rhonchi or rales.  Musculoskeletal:     Cervical back: Normal range of motion and neck supple.     Right lower leg: No edema.     Left lower leg: No edema.  Lymphadenopathy:     Cervical: Cervical adenopathy present.     Right cervical: No superficial or posterior cervical adenopathy.    Left cervical: Superficial cervical adenopathy present. No posterior cervical adenopathy.  Neurological:     Mental Status: She is alert.       No results found for any visits on 08/02/23.  Assessment & Plan     Problem List Items Addressed This Visit   None Visit Diagnoses     Upper respiratory tract infection, unspecified type    -  Primary Visit with patient indicates symptoms comprised of sore throat, myalgias, fever, postnasal drainage, headaches since Friday  congruent with acute URI that is likely viral in nature  She has not tested for COVID at home  Patient is outside therapeutic window for antivirals- Will test for COVID today for rule out.  Due to nature and duration of symptoms recommended treatment regimen is symptomatic relief and follow up if needed Discussed with patient the various viral and bacterial etiologies of current illness and appropriate course of treatment Discussed OTC  medication options for multisymptom relief such as Dayquil/Nyquil, Theraflu, AlkaSeltzer, etc. Will provide Prednisone to assist with sore throat and swelling.  Discussed return precautions if symptoms are not improving or worsen over next 5-7 days.     Sore throat       Relevant Medications   predniSONE (DELTASONE) 20 MG tablet   Other Relevant Orders   Rapid Strep screen([facility]) (Completed)   Novel Coronavirus, NAA ([facility]) (Completed)   Generalized body aches       Relevant Orders   Novel Coronavirus, NAA ([facility]) (Completed)        No follow-ups on file.  I, Aeron Donaghey E Blaiden Werth, PA-C, have reviewed all documentation for this visit. The documentation on 08/04/23 for the exam, diagnosis, procedures, and orders are all accurate and complete.   Jacquelin Hawking, MHS, PA-C [facility]

## 2023-08-02 NOTE — Patient Instructions (Signed)
Based on your described symptoms and the duration of symptoms it is likely that you have a viral upper respiratory infection (often called a "cold")  Symptoms can last for 3-10 days with lingering cough and intermittent symptoms lasting weeks after that.  The goal of treatment at this time is to reduce your symptoms and discomfort   Please stay well hydrated  You can use over the counter medications such as Dayquil/Nyquil, AlkaSeltzer formulations, etc to provide further relief of symptoms according to the manufacturer's instructions   I also recommend adding an antihistamine to your daily regimen This includes medications like Claritin, Allegra, Zyrtec- the generics of these work very well and are usually less expensive I recommend using Flonase nasal spray - 2 puffs twice per day to help with your nasal congestion The antihistamines and Flonase can take a few weeks to provide significant relief from allergy symptoms but should start to provide some benefit soon.  You can alternate Ibuprofen in with the above medications for additional fever and body ache management   If your symptoms do not improve or become worse in the next 5-7 days please make an apt at the office so we can see you  Go to the ER if you begin to have more serious symptoms such as shortness of breath, trouble breathing, loss of consciousness, swelling around the eyes, high fever, severe lasting headaches, vision changes or neck pain/stiffness.

## 2023-08-03 ENCOUNTER — Encounter: Payer: Self-pay | Admitting: Internal Medicine

## 2023-08-03 MED ORDER — ATORVASTATIN CALCIUM 10 MG PO TABS: 10.0000 mg | ORAL_TABLET | Freq: Every day | ORAL | 1 refills | Status: DC

## 2023-08-03 NOTE — Addendum Note (Signed)
Addended by: Lorre Munroe on: 08/03/2023 04:24 PM   Modules accepted: Orders

## 2023-08-04 NOTE — Progress Notes (Signed)
Negative for strep and COVID.

## 2023-08-05 LAB — CULTURE, GROUP A STREP: Strep A Culture: NEGATIVE

## 2023-08-25 ENCOUNTER — Encounter: Payer: Self-pay | Admitting: Internal Medicine

## 2023-08-26 ENCOUNTER — Telehealth (INDEPENDENT_AMBULATORY_CARE_PROVIDER_SITE_OTHER): Payer: BLUE CROSS/BLUE SHIELD | Admitting: Internal Medicine

## 2023-08-26 ENCOUNTER — Encounter: Payer: Self-pay | Admitting: Internal Medicine

## 2023-08-26 DIAGNOSIS — U071 COVID-19: Secondary | ICD-10-CM

## 2023-08-26 NOTE — Telephone Encounter (Signed)
Pt scheduled for a virtual visit today

## 2023-08-26 NOTE — Progress Notes (Signed)
Virtual Visit via Video Note  I connected with Jasmine Miller on 08/26/23 at  2:00 PM EDT by a video enabled telemedicine application and verified that I am speaking with the correct person using two identifiers.  Location: Patient: Home Provider: Office  Person's participating in this video call: Nicki Reaper NP and Jasmine Miller.   I discussed the limitations of evaluation and management by telemedicine and the availability of in person appointments. The patient expressed understanding and agreed to proceed.  History of Present Illness:  Pt reports headache, nasal congestion, sore throat and cough. This started 2 days ago. She is blowing yellow mucous out of her nose. She denies difficulty swallowing. The cough is mostly nonproductive. She denies chills but has had fever and body aches. She has tried ibuprofen and tylenol with some relief of symptoms. She tested positive for covid last night.    Past Medical History:  Diagnosis Date   Hypertension     Current Outpatient Medications  Medication Sig Dispense Refill   amLODipine (NORVASC) 5 MG tablet TAKE 1 TABLET (5 MG TOTAL) BY MOUTH DAILY. OFFICE VISIT NEEDED FOR ADDITIONAL REFILLS 90 tablet 1   atorvastatin (LIPITOR) 10 MG tablet Take 1 tablet (10 mg total) by mouth daily. 90 tablet 1   Bempedoic Acid (NEXLETOL) 180 MG TABS Take 1 tablet (180 mg total) by mouth daily at 12 noon. (Patient not taking: Reported on 08/02/2023) 90 tablet 1   benzonatate (TESSALON) 200 MG capsule Take 1 capsule (200 mg total) by mouth 3 (three) times daily as needed. (Patient not taking: Reported on 08/02/2023) 30 capsule 0   clonazePAM (KLONOPIN) 0.5 MG tablet TAKE 1 TABLET BY MOUTH EVERY DAY AS NEEDED FOR ANXIETY 30 tablet 0   doxycycline (VIBRA-TABS) 100 MG tablet Take 1 tablet (100 mg total) by mouth 2 (two) times daily. (Patient not taking: Reported on 08/02/2023) 20 tablet 0   fluticasone (FLONASE) 50 MCG/ACT nasal spray Place 2 sprays into both  nostrils daily. Use for 4-6 weeks then stop and use seasonally or as needed. 16 g 3   HUMIRA, 2 PEN, 40 MG/0.4ML pen SMARTSIG:40 Milligram(s) SUB-Q Every 2 Weeks     losartan (COZAAR) 100 MG tablet TAKE 1 TABLET BY MOUTH EVERY DAY 90 tablet 1   ondansetron (ZOFRAN-ODT) 4 MG disintegrating tablet Take 1 tablet (4 mg total) by mouth every 8 (eight) hours as needed for nausea or vomiting. (Patient not taking: Reported on 08/02/2023) 30 tablet 0   predniSONE (DELTASONE) 20 MG tablet Take 60mg  PO daily x 2 days, then40mg  PO daily x 2 days, then 20mg  PO daily x 3 days 13 tablet 0   traZODone (DESYREL) 50 MG tablet TAKE 1 TABLET BY MOUTH EVERYDAY AT BEDTIME 90 tablet 1   No current facility-administered medications for this visit.    Allergies  Allergen Reactions   Chantix [Varenicline Tartrate] Swelling    Lip swelling and rash   Penicillins Anaphylaxis    Family History  Problem Relation Age of Onset   Diabetes Mother    Hypertension Mother    Healthy Brother    Breast cancer Maternal Grandmother    Breast cancer Maternal Aunt    Ovarian cancer Maternal Aunt    Breast cancer Maternal Aunt     Social History   Socioeconomic History   Marital status: Single    Spouse name: Not on file   Number of children: Not on file   Years of education: Not on file   Highest  education level: Not on file  Occupational History   Not on file  Tobacco Use   Smoking status: Some Days    Types: E-cigarettes   Smokeless tobacco: Never   Tobacco comments:    only smokes E-Cigarettes  Vaping Use   Vaping status: Every Day  Substance and Sexual Activity   Alcohol use: Yes   Drug use: No   Sexual activity: Not on file  Other Topics Concern   Not on file  Social History Narrative   Not on file   Social Determinants of Health   Financial Resource Strain: Not on file  Food Insecurity: Not on file  Transportation Needs: Not on file  Physical Activity: Not on file  Stress: Not on file  Social  Connections: Not on file  Intimate Partner Violence: Not on file     Constitutional: Pt reports headache. Denies fever, malaise, fatigue, or abrupt weight changes.  HEENT: Pt reports nasal congestion, sore throat. Denies eye pain, eye redness, ear pain, ringing in the ears, wax buildup, runny nose, bloody nose. Respiratory: Pt reports cough. Denies difficulty breathing, shortness of breath, or sputum production.   Cardiovascular: Denies chest pain, chest tightness, palpitations or swelling in the hands or feet.  Gastrointestinal: Denies abdominal pain, bloating, constipation, diarrhea or blood in the stool.   No other specific complaints in a complete review of systems (except as listed in HPI above).  Observations/Objective:  There were no vitals taken for this visit. Wt Readings from Last 3 Encounters:  08/02/23 221 lb 3.2 oz (100.3 kg)  06/22/23 223 lb (101.2 kg)  02/04/23 224 lb (101.6 kg)    General: Appears her stated age, appears unwell but, in NAD. HEENT: Head: normal shape and size; Nose: congestion noted; Throat/Mouth: hoarseness noted Pulmonary/Chest: Normal effort. No respiratory distress.  Neurological: Alert and oriented.     BMET    Component Value Date/Time   NA 139 06/22/2023 0920   NA 142 04/06/2019 1554   NA 137 07/17/2014 1237   K 4.0 06/22/2023 0920   K 3.6 07/17/2014 1237   CL 102 06/22/2023 0920   CL 105 07/17/2014 1237   CO2 30 06/22/2023 0920   CO2 28 07/17/2014 1237   GLUCOSE 127 06/22/2023 0920   GLUCOSE 103 (H) 07/17/2014 1237   BUN 14 06/22/2023 0920   BUN 13 04/06/2019 1554   BUN 8 07/17/2014 1237   CREATININE 0.65 06/22/2023 0920   CALCIUM 9.2 06/22/2023 0920   CALCIUM 8.5 07/17/2014 1237   GFRNONAA 108 04/06/2019 1554   GFRNONAA >60 07/17/2014 1237   GFRAA 125 04/06/2019 1554   GFRAA >60 07/17/2014 1237    Lipid Panel     Component Value Date/Time   CHOL 234 (H) 06/22/2023 0920   TRIG 240 (H) 06/22/2023 0920   HDL 47 (L)  06/22/2023 0920   CHOLHDL 5.0 (H) 06/22/2023 0920   VLDL 68.4 (H) 03/13/2021 1036   LDLCALC 148 (H) 06/22/2023 0920    CBC    Component Value Date/Time   WBC 7.9 06/22/2023 0920   RBC 4.74 06/22/2023 0920   HGB 13.3 06/22/2023 0920   HGB 13.5 07/17/2014 1237   HCT 40.2 06/22/2023 0920   HCT 40.2 07/17/2014 1237   PLT 200 06/22/2023 0920   PLT 204 07/17/2014 1237   MCV 84.8 06/22/2023 0920   MCV 89 07/17/2014 1237   MCH 28.1 06/22/2023 0920   MCHC 33.1 06/22/2023 0920   RDW 13.1 06/22/2023 0920   RDW  13.3 07/17/2014 1237    Hgb A1C Lab Results  Component Value Date   HGBA1C 6.1 (H) 06/22/2023       Assessment and Plan:  COVID-19:  Encouraged rest and fluids Discussed Paxlovid however she is only having mild symptoms so would recommend symptomatic care at this time Recommend Zyrtec and Flonase OTC for nasal congestion and sore throat Recommend cough drops or cough syrup as needed for cough She declines Rx for prednisone or prescription cough syrup at this time Return precautions discussed Work note provided  RTC in 2 months for your annual exam  Follow Up Instructions:    I discussed the assessment and treatment plan with the patient. The patient was provided an opportunity to ask questions and all were answered. The patient agreed with the plan and demonstrated an understanding of the instructions.   The patient was advised to call back or seek an in-person evaluation if the symptoms worsen or if the condition fails to improve as anticipated.    Nicki Reaper, NP

## 2023-08-26 NOTE — Patient Instructions (Signed)

## 2023-09-29 ENCOUNTER — Other Ambulatory Visit: Payer: Self-pay | Admitting: Internal Medicine

## 2023-09-30 MED ORDER — CLONAZEPAM 0.5 MG PO TABS: 0.5000 mg | ORAL_TABLET | Freq: Every day | ORAL | 0 refills | Status: DC | PRN

## 2023-10-14 ENCOUNTER — Encounter: Payer: Self-pay | Admitting: Internal Medicine

## 2023-10-14 NOTE — Telephone Encounter (Signed)
Have her schedule an ER follow-up with me and I can place referral after I reviewed the CT scan

## 2023-10-20 ENCOUNTER — Ambulatory Visit: Payer: BLUE CROSS/BLUE SHIELD | Admitting: Internal Medicine

## 2023-10-20 ENCOUNTER — Encounter: Payer: Self-pay | Admitting: Internal Medicine

## 2023-10-20 VITALS — BP 122/88 | Ht 63.0 in | Wt 205.0 lb

## 2023-10-20 DIAGNOSIS — R1903 Right lower quadrant abdominal swelling, mass and lump: Secondary | ICD-10-CM | POA: Diagnosis not present

## 2023-10-20 DIAGNOSIS — R1031 Right lower quadrant pain: Secondary | ICD-10-CM | POA: Diagnosis not present

## 2023-10-20 NOTE — Patient Instructions (Signed)
Abdominal Pain, Adult    Many things can cause belly (abdominal) pain. In most cases, belly pain is not a serious problem and can be watched and treated at home. But in some cases, it can be serious.  Your doctor will try to find the cause of your belly pain.  Follow these instructions at home:  Medicines  Take over-the-counter and prescription medicines only as told by your doctor.  Do not take medicines that help you poop (laxatives) unless told by your doctor.  General instructions  Watch your belly pain for any changes. Tell your doctor if the pain gets worse.  Drink enough fluid to keep your pee (urine) pale yellow.  Contact a doctor if:  Your belly pain changes or gets worse.  You have very bad cramping or bloating in your belly.  You vomit.  Your pain gets worse with meals, after eating, or with certain foods.  You have trouble pooping or have watery poop for more than 2-3 days.  You are not hungry, or you lose weight without trying.  You have signs of not getting enough fluid or water (dehydration). These may include:  Dark pee, very little pee, or no pee.  Cracked lips or dry mouth.  Feeling sleepy or weak.  You have pain when you pee or poop.  Your belly pain wakes you up at night.  You have blood in your pee.  You have a fever.  Get help right away if:  You cannot stop vomiting.  Your pain is only in one part of your belly, like on the right side.  You have bloody or black poop, or poop that looks like tar.  You have trouble breathing.  You have chest pain.  These symptoms may be an emergency. Get help right away. Call 911.  Do not wait to see if the symptoms will go away.  Do not drive yourself to the hospital.  This information is not intended to replace advice given to you by your health care provider. Make sure you discuss any questions you have with your health care provider.  Document Revised: 09/30/2022 Document Reviewed: 09/30/2022  Elsevier Patient Education  2024 [employer].

## 2023-10-20 NOTE — Progress Notes (Signed)
Subjective:    Patient ID: Jasmine Miller, female    DOB: Jun 09, 1976, 47 y.o.   MRN: [id number]  HPI  Patient presents to clinic today for ER follow-up.  She presented to the ER 10/15 with complaint of right lower quadrant abdominal pain.  Labs were unremarkable.  Pelvic ultrasound did not show any acute findings.  CT abdomen/pelvis showed:  IMPRESSION:   No acute finding in the abdomen or pelvis to explain the patient's right lower quadrant pain.  Indeterminate right retroperitoneal soft tissue density lesion abutting the psoas and appendix without definite involvement. Recommend nonemergent/outpatient evaluation with MRI pelvis with and without contrast and considerations for tissue sampling.  Although no definite lesions of the bowel are seen, location of lesion described above is adjacent to cecum. Consider outpatient referral for gastroenterology/colonoscopy.  She was referred to gastroenterology for a colonoscopy and advised to follow-up with her PCP for an MRI of the abdomen.  Since that time, she worse persistent right lower quadrant pain.  She struggles with constipation from time to time.  She denies nausea, vomiting, diarrhea or blood in her stool.  Review of Systems     Past Medical History:  Diagnosis Date   Hypertension     Current Outpatient Medications  Medication Sig Dispense Refill   amLODipine (NORVASC) 5 MG tablet TAKE 1 TABLET (5 MG TOTAL) BY MOUTH DAILY. OFFICE VISIT NEEDED FOR ADDITIONAL REFILLS 90 tablet 1   atorvastatin (LIPITOR) 10 MG tablet Take 1 tablet (10 mg total) by mouth daily. 90 tablet 1   clonazePAM (KLONOPIN) 0.5 MG tablet Take 1 tablet (0.5 mg total) by mouth daily as needed for anxiety. 30 tablet 0   fluticasone (FLONASE) 50 MCG/ACT nasal spray Place 2 sprays into both nostrils daily. Use for 4-6 weeks then stop and use seasonally or as needed. 16 g 3   HUMIRA, 2 PEN, 40 MG/0.4ML pen SMARTSIG:40 Milligram(s) SUB-Q Every 2 Weeks     losartan  (COZAAR) 100 MG tablet TAKE 1 TABLET BY MOUTH EVERY DAY 90 tablet 1   predniSONE (DELTASONE) 20 MG tablet Take 60mg  PO daily x 2 days, then40mg  PO daily x 2 days, then 20mg  PO daily x 3 days 13 tablet 0   traZODone (DESYREL) 50 MG tablet TAKE 1 TABLET BY MOUTH EVERYDAY AT BEDTIME 90 tablet 1   No current facility-administered medications for this visit.    Allergies  Allergen Reactions   Chantix [Varenicline Tartrate] Swelling    Lip swelling and rash   Penicillins Anaphylaxis    Family History  Problem Relation Age of Onset   Diabetes Mother    Hypertension Mother    Healthy Brother    Breast cancer Maternal Grandmother    Breast cancer Maternal Aunt    Ovarian cancer Maternal Aunt    Breast cancer Maternal Aunt     Social History   Socioeconomic History   Marital status: Single    Spouse name: Not on file   Number of children: Not on file   Years of education: Not on file   Highest education level: Not on file  Occupational History   Not on file  Tobacco Use   Smoking status: Some Days    Types: E-cigarettes   Smokeless tobacco: Never   Tobacco comments:    only smokes E-Cigarettes  Vaping Use   Vaping status: Every Day  Substance and Sexual Activity   Alcohol use: Yes   Drug use: No   Sexual activity: Not on  file  Other Topics Concern   Not on file  Social History Narrative   Not on file   Social Determinants of Health   Financial Resource Strain: Not on file  Food Insecurity: Not on file  Transportation Needs: Not on file  Physical Activity: Not on file  Stress: Not on file  Social Connections: Not on file  Intimate Partner Violence: Not on file     Constitutional: Denies fever, malaise, fatigue, headache or abrupt weight changes.  HEENT: Denies eye pain, eye redness, ear pain, ringing in the ears, wax buildup, runny nose, nasal congestion, bloody nose, or sore throat. Respiratory: Denies difficulty breathing, shortness of breath, cough or sputum  production.   Cardiovascular: Denies chest pain, chest tightness, palpitations or swelling in the hands or feet.  Gastrointestinal: Patient reports RLQ abdominal pain, intermittent constipation.  Denies bloating, diarrhea or blood in the stool.  GU: Denies urgency, frequency, pain with urination, burning sensation, blood in urine, odor or discharge. Musculoskeletal: Denies decrease in range of motion, difficulty with gait, muscle pain or joint pain and swelling.  Skin: Denies redness, rashes, lesions or ulcercations.  Neurological: Patient reports insomnia.  Denies dizziness, difficulty with memory, difficulty with speech or problems with balance and coordination.  Psych: Patient has a history of anxiety and depression.  Denies SI/HI.  No other specific complaints in a complete review of systems (except as listed in HPI above).  Objective:   Physical Exam   BP 122/88   Ht 5\' 3"  (1.6 m)   Wt 205 lb (93 kg)   BMI 36.31 kg/m   Wt Readings from Last 3 Encounters:  08/02/23 221 lb 3.2 oz (100.3 kg)  06/22/23 223 lb (101.2 kg)  02/04/23 224 lb (101.6 kg)    General: Appears her stated age, obese in NAD. Cardiovascular: Normal rate and rhythm. S1,S2 noted.  No murmur, rubs or gallops noted.  Pulmonary/Chest: Normal effort and positive vesicular breath sounds. No respiratory distress. No wheezes, rales or ronchi noted.  Abdomen: Soft and tender in the RLQ. Normal bowel sounds. No distention or masses noted. Liver, spleen and kidneys non palpable. Musculoskeletal:  No difficulty with gait.  Neurological: Alert and oriented.Coordination normal.    BMET    Component Value Date/Time   NA 139 06/22/2023 0920   NA 142 04/06/2019 1554   NA 137 07/17/2014 1237   K 4.0 06/22/2023 0920   K 3.6 07/17/2014 1237   CL 102 06/22/2023 0920   CL 105 07/17/2014 1237   CO2 30 06/22/2023 0920   CO2 28 07/17/2014 1237   GLUCOSE 127 06/22/2023 0920   GLUCOSE 103 (H) 07/17/2014 1237   BUN 14  06/22/2023 0920   BUN 13 04/06/2019 1554   BUN 8 07/17/2014 1237   CREATININE 0.65 06/22/2023 0920   CALCIUM 9.2 06/22/2023 0920   CALCIUM 8.5 07/17/2014 1237   GFRNONAA 108 04/06/2019 1554   GFRNONAA >60 07/17/2014 1237   GFRAA 125 04/06/2019 1554   GFRAA >60 07/17/2014 1237    Lipid Panel     Component Value Date/Time   CHOL 234 (H) 06/22/2023 0920   TRIG 240 (H) 06/22/2023 0920   HDL 47 (L) 06/22/2023 0920   CHOLHDL 5.0 (H) 06/22/2023 0920   VLDL 68.4 (H) 03/13/2021 1036   LDLCALC 148 (H) 06/22/2023 0920    CBC    Component Value Date/Time   WBC 7.9 06/22/2023 0920   RBC 4.74 06/22/2023 0920   HGB 13.3 06/22/2023 0920  HGB 13.5 07/17/2014 1237   HCT 40.2 06/22/2023 0920   HCT 40.2 07/17/2014 1237   PLT 200 06/22/2023 0920   PLT 204 07/17/2014 1237   MCV 84.8 06/22/2023 0920   MCV 89 07/17/2014 1237   MCH 28.1 06/22/2023 0920   MCHC 33.1 06/22/2023 0920   RDW 13.1 06/22/2023 0920   RDW 13.3 07/17/2014 1237    Hgb A1C Lab Results  Component Value Date   HGBA1C 6.1 (H) 06/22/2023           Assessment & Plan:   ER follow-up for RLQ abdominal pain, abdominal mass:  ER notes, labs and imaging reviewed MRI abdomen ordered We will hold off on referral to GI until we get results from MRI  Schedule an appointment for your annual exam Nicki Reaper, NP

## 2023-10-22 ENCOUNTER — Telehealth: Payer: Self-pay | Admitting: Internal Medicine

## 2023-10-22 ENCOUNTER — Encounter: Payer: Self-pay | Admitting: Internal Medicine

## 2023-10-22 NOTE — Telephone Encounter (Signed)
Pt is calling to report to [facility] that [facility] in [place] is fine for the referral.

## 2023-10-27 ENCOUNTER — Ambulatory Visit: Admission: RE | Admit: 2023-10-27 | Payer: BLUE CROSS/BLUE SHIELD | Source: Ambulatory Visit

## 2023-10-28 ENCOUNTER — Encounter: Payer: Self-pay | Admitting: Internal Medicine

## 2023-10-28 DIAGNOSIS — R1903 Right lower quadrant abdominal swelling, mass and lump: Secondary | ICD-10-CM

## 2023-11-03 NOTE — Addendum Note (Signed)
Addended by: Lorre Munroe on: 11/03/2023 09:45 AM   Modules accepted: Orders

## 2023-11-08 ENCOUNTER — Telehealth: Payer: BLUE CROSS/BLUE SHIELD | Admitting: Internal Medicine

## 2023-11-08 DIAGNOSIS — A084 Viral intestinal infection, unspecified: Secondary | ICD-10-CM

## 2023-11-08 NOTE — Patient Instructions (Signed)
Gastroenteritis You will learn about gastroenteritis, or stomach flu, symptoms, treatment, and when to seek medical care. To view the content, go to this web address: [email or web address]  This video will expire on: 06/20/2025. If you need access to this video following this date, please reach out to the healthcare provider who assigned it to you. This information is not intended to replace advice given to you by your health care provider. Make sure you discuss any questions you have with your health care provider. Elsevier Patient Education  2024 [employer].

## 2023-11-08 NOTE — Progress Notes (Signed)
Virtual Visit via Video Note  I connected with Jasmine Miller on 11/08/23 at  4:00 PM EST by a video enabled telemedicine application and verified that I am speaking with the correct person using two identifiers.  Location: Patient: Home Provider: Office  Person's participating in this video call: Nicki Reaper, NP-C and Jasmine Miller.   I discussed the limitations of evaluation and management by telemedicine and the availability of in person appointments. The patient expressed understanding and agreed to proceed.  History of Present Illness:  Patient reports that abruptly at 7 PM last night she developed chills, nausea, vomiting and diarrhea.  She reports that persisted throughout the night and symptoms seem to have resolved as of an hour ago.  She denies blood in her vomit or stool.  She reports she had sick contacts with similar symptoms about 1 week ago and is not sure if she caught this from her grandchild.  No one else in the home had similar symptoms.  She is requesting a work note today.  Past Medical History:  Diagnosis Date   Hypertension     Current Outpatient Medications  Medication Sig Dispense Refill   amLODipine (NORVASC) 5 MG tablet TAKE 1 TABLET (5 MG TOTAL) BY MOUTH DAILY. OFFICE VISIT NEEDED FOR ADDITIONAL REFILLS 90 tablet 1   atorvastatin (LIPITOR) 10 MG tablet Take 1 tablet (10 mg total) by mouth daily. 90 tablet 1   clonazePAM (KLONOPIN) 0.5 MG tablet Take 1 tablet (0.5 mg total) by mouth daily as needed for anxiety. 30 tablet 0   fluticasone (FLONASE) 50 MCG/ACT nasal spray Place 2 sprays into both nostrils daily. Use for 4-6 weeks then stop and use seasonally or as needed. 16 g 3   HUMIRA, 2 PEN, 40 MG/0.4ML pen SMARTSIG:40 Milligram(s) SUB-Q Every 2 Weeks     losartan (COZAAR) 100 MG tablet TAKE 1 TABLET BY MOUTH EVERY DAY 90 tablet 1   NONFORMULARY OR COMPOUNDED ITEM TRIZEPATIDE     traZODone (DESYREL) 50 MG tablet TAKE 1 TABLET BY MOUTH EVERYDAY AT  BEDTIME 90 tablet 1   No current facility-administered medications for this visit.    Allergies  Allergen Reactions   Chantix [Varenicline Tartrate] Swelling    Lip swelling and rash   Penicillins Anaphylaxis    Family History  Problem Relation Age of Onset   Diabetes Mother    Hypertension Mother    Healthy Brother    Breast cancer Maternal Grandmother    Breast cancer Maternal Aunt    Ovarian cancer Maternal Aunt    Breast cancer Maternal Aunt     Social History   Socioeconomic History   Marital status: Single    Spouse name: Not on file   Number of children: Not on file   Years of education: Not on file   Highest education level: Not on file  Occupational History   Not on file  Tobacco Use   Smoking status: Some Days    Types: E-cigarettes   Smokeless tobacco: Never   Tobacco comments:    only smokes E-Cigarettes  Vaping Use   Vaping status: Every Day  Substance and Sexual Activity   Alcohol use: Yes   Drug use: No   Sexual activity: Not on file  Other Topics Concern   Not on file  Social History Narrative   Not on file   Social Determinants of Health   Financial Resource Strain: Not on file  Food Insecurity: Not on file  Transportation Needs: Not on  file  Physical Activity: Not on file  Stress: Not on file  Social Connections: Not on file  Intimate Partner Violence: Not on file     Constitutional: Denies fever, malaise, fatigue, headache or abrupt weight changes.  Respiratory: Denies difficulty breathing, shortness of breath, cough or sputum production.   Cardiovascular: Denies chest pain, chest tightness, palpitations or swelling in the hands or feet.  Gastrointestinal: Pt reports nausea, vomiting and diarrhea. Denies abdominal pain, bloating, constipation, or blood in the stool.  GU: Denies urgency, frequency, pain with urination, burning sensation, blood in urine, odor or discharge.   No other specific complaints in a complete review of  systems (except as listed in HPI above).    Observations/Objective:  There were no vitals taken for this visit. Wt Readings from Last 3 Encounters:  10/20/23 205 lb (93 kg)  08/02/23 221 lb 3.2 oz (100.3 kg)  06/22/23 223 lb (101.2 kg)    General: Appears her stated age, obese, in NAD. Pulmonary/Chest: Normal effort. No respiratory distress.  Neurological: Alert and oriented.   BMET    Component Value Date/Time   NA 139 06/22/2023 0920   NA 142 04/06/2019 1554   NA 137 07/17/2014 1237   K 4.0 06/22/2023 0920   K 3.6 07/17/2014 1237   CL 102 06/22/2023 0920   CL 105 07/17/2014 1237   CO2 30 06/22/2023 0920   CO2 28 07/17/2014 1237   GLUCOSE 127 06/22/2023 0920   GLUCOSE 103 (H) 07/17/2014 1237   BUN 14 06/22/2023 0920   BUN 13 04/06/2019 1554   BUN 8 07/17/2014 1237   CREATININE 0.65 06/22/2023 0920   CALCIUM 9.2 06/22/2023 0920   CALCIUM 8.5 07/17/2014 1237   GFRNONAA 108 04/06/2019 1554   GFRNONAA >60 07/17/2014 1237   GFRAA 125 04/06/2019 1554   GFRAA >60 07/17/2014 1237    Lipid Panel     Component Value Date/Time   CHOL 234 (H) 06/22/2023 0920   TRIG 240 (H) 06/22/2023 0920   HDL 47 (L) 06/22/2023 0920   CHOLHDL 5.0 (H) 06/22/2023 0920   VLDL 68.4 (H) 03/13/2021 1036   LDLCALC 148 (H) 06/22/2023 0920    CBC    Component Value Date/Time   WBC 7.9 06/22/2023 0920   RBC 4.74 06/22/2023 0920   HGB 13.3 06/22/2023 0920   HGB 13.5 07/17/2014 1237   HCT 40.2 06/22/2023 0920   HCT 40.2 07/17/2014 1237   PLT 200 06/22/2023 0920   PLT 204 07/17/2014 1237   MCV 84.8 06/22/2023 0920   MCV 89 07/17/2014 1237   MCH 28.1 06/22/2023 0920   MCHC 33.1 06/22/2023 0920   RDW 13.1 06/22/2023 0920   RDW 13.3 07/17/2014 1237    Hgb A1C Lab Results  Component Value Date   HGBA1C 6.1 (H) 06/22/2023        Assessment and Plan:  Viral Gastroenteritis:  Symptoms already improving Encouraged adequate water intake to avoid dehydration Encouraged thorough  handwashing to prevent spread of possible viral illness Okay to take Emetrol OTC as needed for nausea and vomiting Work note provided  Follow Up Instructions:    I discussed the assessment and treatment plan with the patient. The patient was provided an opportunity to ask questions and all were answered. The patient agreed with the plan and demonstrated an understanding of the instructions.   The patient was advised to call back or seek an in-person evaluation if the symptoms worsen or if the condition fails to improve as  anticipated.    Nicki Reaper, NP

## 2023-11-10 ENCOUNTER — Encounter: Payer: Self-pay | Admitting: Internal Medicine

## 2023-11-11 ENCOUNTER — Encounter: Payer: Self-pay | Admitting: Internal Medicine

## 2023-11-11 NOTE — Telephone Encounter (Signed)
Okay to release work note to patient's MyChart extending her leave of absence to return on Monday, November 18

## 2024-01-05 ENCOUNTER — Other Ambulatory Visit: Payer: Self-pay | Admitting: Internal Medicine

## 2024-01-10 ENCOUNTER — Encounter: Payer: Self-pay | Admitting: Internal Medicine

## 2024-01-10 ENCOUNTER — Other Ambulatory Visit: Payer: Self-pay | Admitting: Internal Medicine

## 2024-01-10 NOTE — Telephone Encounter (Signed)
 Requested medication (s) are due for refill today: yes  Requested medication (s) are on the active medication list: yes  Last refill:  09/30/23  Future visit scheduled: no  Notes to clinic:  Unable to refill per protocol, cannot delegate.      Requested Prescriptions  Pending Prescriptions Disp Refills   clonazePAM  (KLONOPIN ) 0.5 MG tablet [Pharmacy Med Name: CLONAZEPAM  0.5 MG TABLET] 30 tablet 0    Sig: TAKE 1 TABLET BY MOUTH EVERY DAY AS NEEDED FOR ANXIETY     Not Delegated - Psychiatry: Anxiolytics/Hypnotics 2 Failed - 01/10/2024  9:22 AM      Failed - This refill cannot be delegated      Failed - Urine Drug Screen completed in last 360 days      Passed - Patient is not pregnant      Passed - Valid encounter within last 6 months    Recent Outpatient Visits           2 months ago Viral gastroenteritis   [place] [facility] [place], Angeline ORN, NP   2 months ago RLQ abdominal pain   [facility] [place], Angeline ORN, NP   4 months ago COVID-19   [facility] [place], Angeline ORN, NP   5 months ago Upper respiratory tract infection, unspecified type   [place] [facility] [place], Rocky BRAVO, PA-C   6 months ago Prediabetes   [place] [facility] [place], Angeline ORN, [place]

## 2024-01-19 ENCOUNTER — Encounter: Payer: BLUE CROSS/BLUE SHIELD | Admitting: Internal Medicine

## 2024-01-28 ENCOUNTER — Other Ambulatory Visit: Payer: Self-pay | Admitting: Internal Medicine

## 2024-01-28 DIAGNOSIS — I10 Essential (primary) hypertension: Secondary | ICD-10-CM

## 2024-01-28 NOTE — Telephone Encounter (Signed)
Requested Prescriptions  Pending Prescriptions Disp Refills   traZODone (DESYREL) 50 MG tablet [Pharmacy Med Name: TRAZODONE 50 MG TABLET] 90 tablet 0    Sig: TAKE 1 TABLET BY MOUTH EVERYDAY AT BEDTIME     Psychiatry: Antidepressants - Serotonin Modulator Passed - 01/28/2024  2:34 PM      Passed - Completed PHQ-2 or PHQ-9 in the last 360 days      Passed - Valid encounter within last 6 months    Recent Outpatient Visits           2 months ago Viral gastroenteritis   [facility] [place], Salvadore [place], NP   3 months ago RLQ abdominal pain   [place] [facility] [place], [place], NP   5 months ago COVID-19   [facility] [place], Salvadore [place], NP   5 months ago Upper respiratory tract infection, unspecified type   [place] [facility] [place], Oswaldo Conroy, PA-C   7 months ago Prediabetes   [place] [facility] [place], [place] W, NP               losartan (COZAAR) 100 MG tablet [Pharmacy Med Name: LOSARTAN POTASSIUM 100 MG TAB] 90 tablet 0    Sig: TAKE 1 TABLET BY MOUTH EVERY DAY     Cardiovascular:  Angiotensin Receptor Blockers Failed - 01/28/2024  2:34 PM      Failed - Cr in normal range and within 180 days    Creat  Date Value Ref Range Status  06/22/2023 0.65 0.50 - 0.99 mg/dL Final         Failed - K in normal range and within 180 days    Potassium  Date Value Ref Range Status  06/22/2023 4.0 3.5 - 5.3 mmol/L Final  07/17/2014 3.6 3.5 - 5.1 mmol/L Final         Passed - Patient is not pregnant      Passed - Last BP in normal range    BP Readings from Last 1 Encounters:  10/20/23 122/88         Passed - Valid encounter within last 6 months    Recent Outpatient Visits           2 months ago Viral gastroenteritis   [place] [facility] [place], Salvadore [place], NP   3 months ago RLQ abdominal pain   [place] [facility] [place], Salvadore [place], NP   5  months ago COVID-19   [place] [facility] [place], Salvadore [place], NP   5 months ago Upper respiratory tract infection, unspecified type   [place] [facility] [place], Oswaldo Conroy, PA-C   7 months ago Prediabetes    [place] [place], Salvadore [place], [place]

## 2024-02-11 ENCOUNTER — Encounter: Payer: Self-pay | Admitting: Internal Medicine

## 2024-02-11 ENCOUNTER — Ambulatory Visit: Payer: BLUE CROSS/BLUE SHIELD | Admitting: Internal Medicine

## 2024-02-11 VITALS — BP 138/84 | Ht 63.0 in | Wt 185.4 lb

## 2024-02-11 DIAGNOSIS — Z1211 Encounter for screening for malignant neoplasm of colon: Secondary | ICD-10-CM

## 2024-02-11 DIAGNOSIS — R3915 Urgency of urination: Secondary | ICD-10-CM

## 2024-02-11 DIAGNOSIS — Z0001 Encounter for general adult medical examination with abnormal findings: Secondary | ICD-10-CM | POA: Diagnosis not present

## 2024-02-11 DIAGNOSIS — R300 Dysuria: Secondary | ICD-10-CM | POA: Diagnosis not present

## 2024-02-11 DIAGNOSIS — R350 Frequency of micturition: Secondary | ICD-10-CM | POA: Diagnosis not present

## 2024-02-11 DIAGNOSIS — Z1231 Encounter for screening mammogram for malignant neoplasm of breast: Secondary | ICD-10-CM

## 2024-02-11 DIAGNOSIS — R109 Unspecified abdominal pain: Secondary | ICD-10-CM | POA: Diagnosis not present

## 2024-02-11 DIAGNOSIS — E782 Mixed hyperlipidemia: Secondary | ICD-10-CM

## 2024-02-11 DIAGNOSIS — E66811 Obesity, class 1: Secondary | ICD-10-CM

## 2024-02-11 DIAGNOSIS — R7303 Prediabetes: Secondary | ICD-10-CM

## 2024-02-11 DIAGNOSIS — R8290 Unspecified abnormal findings in urine: Secondary | ICD-10-CM

## 2024-02-11 DIAGNOSIS — E6609 Other obesity due to excess calories: Secondary | ICD-10-CM

## 2024-02-11 DIAGNOSIS — Z6832 Body mass index (BMI) 32.0-32.9, adult: Secondary | ICD-10-CM

## 2024-02-11 LAB — POCT URINE DIPSTICK
Bilirubin, UA: NEGATIVE
Blood, UA: NEGATIVE
Glucose, UA: NEGATIVE mg/dL
Ketones, POC UA: NEGATIVE mg/dL
Leukocytes, UA: NEGATIVE
Nitrite, UA: NEGATIVE
POC PROTEIN,UA: NEGATIVE
Spec Grav, UA: 1.01 (ref 1.010–1.025)
Urobilinogen, UA: 0.2 U/dL
pH, UA: 7 (ref 5.0–8.0)

## 2024-02-11 MED ORDER — AMLODIPINE BESYLATE 5 MG PO TABS: 5.0000 mg | ORAL_TABLET | Freq: Every day | ORAL | 1 refills | Status: DC

## 2024-02-11 MED ORDER — CLONAZEPAM 0.5 MG PO TABS: 0.5000 mg | ORAL_TABLET | Freq: Every day | ORAL | 0 refills | Status: DC | PRN

## 2024-02-11 NOTE — Assessment & Plan Note (Signed)
Encourage diet and exercise for weight loss

## 2024-02-11 NOTE — Patient Instructions (Signed)

## 2024-02-11 NOTE — Addendum Note (Signed)
Addended by: Luanna Cole D on: 02/11/2024 01:24 PM   Modules accepted: Orders

## 2024-02-11 NOTE — Progress Notes (Signed)
Subjective:    Patient ID: Jasmine Miller, female    DOB: 29-Sep-1976, 48 y.o.   MRN: [id number]  HPI  Patient presents to clinic today for her annual exam.  She also reports right flank pain, urinary urgency, frequency and burning with urination.  She also reports urine odor.  She denies fever, chills, nausea or vomiting.  She has not tried anything OTC for this.  Flu: never Tetanus: unsure COVID: never Pap smear: 11/2017, hysterectomy Mammogram: 08/2018 Colon screening: never Vision screening: annually Dentist: biannually  Diet: She does eat meat. She consumes fruits and veggies. She does eat some fried foods. She drinks mostly water. Exercise: Gym  Review of Systems     Past Medical History:  Diagnosis Date   Hypertension     Current Outpatient Medications  Medication Sig Dispense Refill   amLODipine (NORVASC) 5 MG tablet TAKE 1 TABLET (5 MG TOTAL) BY MOUTH DAILY. OFFICE VISIT NEEDED FOR ADDITIONAL REFILLS 90 tablet 1   atorvastatin (LIPITOR) 10 MG tablet Take 1 tablet (10 mg total) by mouth daily. 90 tablet 1   clonazePAM (KLONOPIN) 0.5 MG tablet Take 1 tablet (0.5 mg total) by mouth daily as needed for anxiety. 30 tablet 0   fluticasone (FLONASE) 50 MCG/ACT nasal spray Place 2 sprays into both nostrils daily. Use for 4-6 weeks then stop and use seasonally or as needed. 16 g 3   HUMIRA, 2 PEN, 40 MG/0.4ML pen SMARTSIG:40 Milligram(s) SUB-Q Every 2 Weeks     losartan (COZAAR) 100 MG tablet TAKE 1 TABLET BY MOUTH EVERY DAY 90 tablet 0   NONFORMULARY OR COMPOUNDED ITEM TRIZEPATIDE     traZODone (DESYREL) 50 MG tablet TAKE 1 TABLET BY MOUTH EVERYDAY AT BEDTIME 90 tablet 0   No current facility-administered medications for this visit.    Allergies  Allergen Reactions   Chantix [Varenicline Tartrate] Swelling    Lip swelling and rash   Penicillins Anaphylaxis    Family History  Problem Relation Age of Onset   Diabetes Mother    Hypertension Mother    Healthy  Brother    Breast cancer Maternal Grandmother    Breast cancer Maternal Aunt    Ovarian cancer Maternal Aunt    Breast cancer Maternal Aunt     Social History   Socioeconomic History   Marital status: Single    Spouse name: Not on file   Number of children: Not on file   Years of education: Not on file   Highest education level: Not on file  Occupational History   Not on file  Tobacco Use   Smoking status: Some Days    Types: E-cigarettes   Smokeless tobacco: Never   Tobacco comments:    only smokes E-Cigarettes  Vaping Use   Vaping status: Every Day  Substance and Sexual Activity   Alcohol use: Yes   Drug use: No   Sexual activity: Not on file  Other Topics Concern   Not on file  Social History Narrative   Not on file   Social Drivers of Health   Financial Resource Strain: Not on file  Food Insecurity: Not on file  Transportation Needs: Not on file  Physical Activity: Not on file  Stress: Not on file  Social Connections: Not on file  Intimate Partner Violence: Not on file     Constitutional: Denies fever, malaise, fatigue, headache or abrupt weight changes.  HEENT: Denies eye pain, eye redness, ear pain, ringing in the ears, wax buildup, runny  nose, nasal congestion, bloody nose, or sore throat. Respiratory: Denies difficulty breathing, shortness of breath, cough or sputum production.   Cardiovascular: Denies chest pain, chest tightness, palpitations or swelling in the hands or feet.  Gastrointestinal: Pt reports intermittent reflux, right flank pain. Denies abdominal pain, bloating, constipation, diarrhea or blood in the stool.  GU: Pt reports urinary urgency, burning with urination and urine odor. Denies frequency, pain with urination, blood in urine, or discharge. Musculoskeletal: Denies decrease in range of motion, difficulty with gait, muscle pain or joint pain and swelling.  Skin: Denies redness, rashes, lesions or ulcercations.  Neurological: Pt reports  insomnia. Denies dizziness, difficulty with memory, difficulty with speech or problems with balance and coordination.  Psych: Patient has a history of anxiety and depression.  Denies SI/HI.  No other specific complaints in a complete review of systems (except as listed in HPI above).  Objective:   Physical Exam BP 138/84 (BP Location: Left Arm, Patient Position: Sitting, Cuff Size: Large)   Ht 5\' 3"  (1.6 m)   Wt 185 lb 6.4 oz (84.1 kg)   BMI 32.84 kg/m    Wt Readings from Last 3 Encounters:  10/20/23 205 lb (93 kg)  08/02/23 221 lb 3.2 oz (100.3 kg)  06/22/23 223 lb (101.2 kg)    General: Appears her stated age, obese in NAD. Skin: Warm, dry and intact.  HEENT: Head: normal shape and size; Eyes: sclera white, no icterus, conjunctiva pink, PERRLA and EOMs intact;  Neck:  Neck supple, trachea midline. No masses, lumps or thyromegaly present.  Cardiovascular: Normal rate and rhythm. S1,S2 noted.  No murmur, rubs or gallops noted. No JVD or BLE edema.  Pulmonary/Chest: Normal effort and positive vesicular breath sounds. No respiratory distress. No wheezes, rales or ronchi noted.  Abdomen:  Normal bowel sounds.  + CVA tenderness on the right. Musculoskeletal: No pain with palpation of the lumbar spine or paralumbar muscles.  Strength 5/5 BUE/BLE.  No difficulty with gait.  Neurological: Alert and oriented. Cranial nerves II-XII grossly intact. Coordination normal.  Psychiatric: Mood and affect normal. Behavior is normal. Judgment and thought content normal.    BMET    Component Value Date/Time   NA 139 06/22/2023 0920   NA 142 04/06/2019 1554   NA 137 07/17/2014 1237   K 4.0 06/22/2023 0920   K 3.6 07/17/2014 1237   CL 102 06/22/2023 0920   CL 105 07/17/2014 1237   CO2 30 06/22/2023 0920   CO2 28 07/17/2014 1237   GLUCOSE 127 06/22/2023 0920   GLUCOSE 103 (H) 07/17/2014 1237   BUN 14 06/22/2023 0920   BUN 13 04/06/2019 1554   BUN 8 07/17/2014 1237   CREATININE 0.65  06/22/2023 0920   CALCIUM 9.2 06/22/2023 0920   CALCIUM 8.5 07/17/2014 1237   GFRNONAA 108 04/06/2019 1554   GFRNONAA >60 07/17/2014 1237   GFRAA 125 04/06/2019 1554   GFRAA >60 07/17/2014 1237    Lipid Panel     Component Value Date/Time   CHOL 234 (H) 06/22/2023 0920   TRIG 240 (H) 06/22/2023 0920   HDL 47 (L) 06/22/2023 0920   CHOLHDL 5.0 (H) 06/22/2023 0920   VLDL 68.4 (H) 03/13/2021 1036   LDLCALC 148 (H) 06/22/2023 0920    CBC    Component Value Date/Time   WBC 7.9 06/22/2023 0920   RBC 4.74 06/22/2023 0920   HGB 13.3 06/22/2023 0920   HGB 13.5 07/17/2014 1237   HCT 40.2 06/22/2023 0920   HCT  40.2 07/17/2014 1237   PLT 200 06/22/2023 0920   PLT 204 07/17/2014 1237   MCV 84.8 06/22/2023 0920   MCV 89 07/17/2014 1237   MCH 28.1 06/22/2023 0920   MCHC 33.1 06/22/2023 0920   RDW 13.1 06/22/2023 0920   RDW 13.3 07/17/2014 1237    Hgb A1C Lab Results  Component Value Date   HGBA1C 6.1 (H) 06/22/2023           Assessment & Plan:   Preventative Health Maintenance:  She declines flu shot today She declines tetanus today Encouraged her to get her COVID-vaccine She longer needs Pap smears Mammogram ordered-she will call to schedule Referral to GI for screening colonoscopy Encouraged her to consume a balanced diet and exercise regimen Asked her to see an eye doctor and dentist annually We will check CBC, c-Met, lipid, A1c today  Urinary frequency, urinary urgency, burning with urination, urine odor and right flank pain:  Urinalysis normal Push fluids , RTC in 6 months, follow-up chronic conditions Nicki Reaper, NP

## 2024-02-12 LAB — COMPLETE METABOLIC PANEL WITH GFR
AG Ratio: 1.4 (calc) (ref 1.0–2.5)
ALT: 16 U/L (ref 6–29)
AST: 17 U/L (ref 10–35)
Albumin: 4.3 g/dL (ref 3.6–5.1)
Alkaline phosphatase (APISO): 57 U/L (ref 31–125)
BUN: 18 mg/dL (ref 7–25)
CO2: 28 mmol/L (ref 20–32)
Calcium: 9.3 mg/dL (ref 8.6–10.2)
Chloride: 102 mmol/L (ref 98–110)
Creat: 0.65 mg/dL (ref 0.50–0.99)
Globulin: 3 g/dL (ref 1.9–3.7)
Glucose, Bld: 80 mg/dL (ref 65–139)
Potassium: 3.8 mmol/L (ref 3.5–5.3)
Sodium: 138 mmol/L (ref 135–146)
Total Bilirubin: 0.3 mg/dL (ref 0.2–1.2)
Total Protein: 7.3 g/dL (ref 6.1–8.1)
eGFR: 109 mL/min/{1.73_m2} (ref >=60)

## 2024-02-12 LAB — HEMOGLOBIN A1C
Hgb A1c MFr Bld: 5.2 %{Hb} (ref <5.7)
Mean Plasma Glucose: 103 mg/dL
eAG (mmol/L): 5.7 mmol/L

## 2024-02-12 LAB — LIPID PANEL
Cholesterol: 184 mg/dL (ref <200)
HDL: 45 mg/dL — ABNORMAL LOW (ref >=50)
LDL Cholesterol (Calc): 112 mg/dL — ABNORMAL HIGH
Non-HDL Cholesterol (Calc): 139 mg/dL — ABNORMAL HIGH (ref <130)
Total CHOL/HDL Ratio: 4.1 (calc) (ref <5.0)
Triglycerides: 157 mg/dL — ABNORMAL HIGH (ref <150)

## 2024-02-12 LAB — CBC
HCT: 40.5 % (ref 35.0–45.0)
Hemoglobin: 13.1 g/dL (ref 11.7–15.5)
MCH: 28 pg (ref 27.0–33.0)
MCHC: 32.3 g/dL (ref 32.0–36.0)
MCV: 86.5 fL (ref 80.0–100.0)
MPV: 11.7 fL (ref 7.5–12.5)
Platelets: 236 10*3/uL (ref 140–400)
RBC: 4.68 10*6/uL (ref 3.80–5.10)
RDW: 13.1 % (ref 11.0–15.0)
WBC: 8.4 10*3/uL (ref 3.8–10.8)

## 2024-02-14 ENCOUNTER — Other Ambulatory Visit: Payer: Self-pay

## 2024-02-14 ENCOUNTER — Encounter: Payer: Self-pay | Admitting: Internal Medicine

## 2024-02-14 MED ORDER — ATORVASTATIN CALCIUM 20 MG PO TABS: 20.0000 mg | ORAL_TABLET | Freq: Every day | ORAL | 3 refills | Status: DC

## 2024-02-18 ENCOUNTER — Ambulatory Visit: Payer: BLUE CROSS/BLUE SHIELD | Admitting: Internal Medicine

## 2024-02-24 ENCOUNTER — Encounter: Payer: Self-pay | Admitting: *Deleted

## 2024-02-25 ENCOUNTER — Encounter: Payer: Self-pay | Admitting: *Deleted

## 2024-02-25 ENCOUNTER — Ambulatory Visit: Payer: BLUE CROSS/BLUE SHIELD | Admitting: Internal Medicine

## 2024-02-29 ENCOUNTER — Telehealth: Payer: Self-pay | Admitting: *Deleted

## 2024-02-29 ENCOUNTER — Other Ambulatory Visit: Payer: Self-pay | Admitting: *Deleted

## 2024-02-29 ENCOUNTER — Telehealth: Payer: Self-pay

## 2024-02-29 DIAGNOSIS — Z1211 Encounter for screening for malignant neoplasm of colon: Secondary | ICD-10-CM

## 2024-02-29 DIAGNOSIS — Z800 Family history of malignant neoplasm of digestive organs: Secondary | ICD-10-CM

## 2024-02-29 MED ORDER — NA SULFATE-K SULFATE-MG SULF 17.5-3.13-1.6 GM/177ML PO SOLN
1.0000 | Freq: Once | ORAL | 0 refills | Status: AC
Start: 2024-02-29 — End: 2024-02-29

## 2024-02-29 NOTE — Telephone Encounter (Signed)
 Gastroenterology Pre-Procedure Review  Request Date: 03/24/2024 Requesting Physician: Dr. Allegra Lai  PATIENT REVIEW QUESTIONS: The patient responded to the following health history questions as indicated:    1. Are you having any GI issues? no 2. Do you have a personal history of Polyps? no 3. Do you have a family history of Colon Cancer or Polyps? yes (father had colon cancer) 4. Diabetes Mellitus? no 5. Joint replacements in the past 12 months?no 6. Major health problems in the past 3 months?no 7. Any artificial heart valves, MVP, or defibrillator?no    Patient is taking Tirzepatide for weight management.  MEDICATIONS & ALLERGIES:    Patient reports the following regarding taking any anticoagulation/antiplatelet therapy:   Plavix, Coumadin, Eliquis, Xarelto, Lovenox, Pradaxa, Brilinta, or Effient? no Aspirin? no  Patient confirms/reports the following medications:  Current Outpatient Medications  Medication Sig Dispense Refill   amLODipine (NORVASC) 5 MG tablet Take 1 tablet (5 mg total) by mouth daily. OFFICE VISIT NEEDED FOR ADDITIONAL REFILLS 90 tablet 1   atorvastatin (LIPITOR) 20 MG tablet Take 1 tablet (20 mg total) by mouth daily. 90 tablet 3   clonazePAM (KLONOPIN) 0.5 MG tablet Take 1 tablet (0.5 mg total) by mouth daily as needed for anxiety. 30 tablet 0   fluticasone (FLONASE) 50 MCG/ACT nasal spray Place 2 sprays into both nostrils daily. Use for 4-6 weeks then stop and use seasonally or as needed. 16 g 3   losartan (COZAAR) 100 MG tablet TAKE 1 TABLET BY MOUTH EVERY DAY 90 tablet 0   NONFORMULARY OR COMPOUNDED ITEM TRIZEPATIDE     traZODone (DESYREL) 50 MG tablet TAKE 1 TABLET BY MOUTH EVERYDAY AT BEDTIME 90 tablet 0   No current facility-administered medications for this visit.    Patient confirms/reports the following allergies:  Allergies  Allergen Reactions   Chantix [Varenicline Tartrate] Swelling    Lip swelling and rash   Penicillins Anaphylaxis    No  orders of the defined types were placed in this encounter.   AUTHORIZATION INFORMATION Primary Insurance: 1D#: Group #:  Secondary Insurance: 1D#: Group #:  SCHEDULE INFORMATION: Date: 03/24/2024 Time: Location:  [facility]

## 2024-02-29 NOTE — Telephone Encounter (Signed)
 Colonoscopy schedule on 03/24/2024 with Dr Allegra Lai at [facility]

## 2024-02-29 NOTE — Telephone Encounter (Signed)
 PT requesting call back to schedule colonoscopy

## 2024-03-02 ENCOUNTER — Telehealth: Payer: Self-pay | Admitting: *Deleted

## 2024-03-02 NOTE — Telephone Encounter (Signed)
 Spoken to patient and inform her that we had to reschedule her colonoscopy because Dr Allegra Lai will be out on PAL.  We have reschedule her to 04/07/2024.  Notified Kim of the change.  New instructions will be send with the new date. [facility] already processing prep solution.

## 2024-03-29 ENCOUNTER — Encounter: Payer: Self-pay | Admitting: Gastroenterology

## 2024-03-29 NOTE — Anesthesia Preprocedure Evaluation (Addendum)
 Anesthesia Evaluation  Patient identified by MRN, date of birth, ID band Patient awake    Reviewed: Allergy & Precautions, H&P , NPO status , Patient's Chart, lab work & pertinent test results  Airway Mallampati: II  TM Distance: >3 FB     Dental   Pulmonary neg pulmonary ROS, Current Smoker          Cardiovascular hypertension, negative cardio ROS      Neuro/Psych  PSYCHIATRIC DISORDERS Anxiety Depression    negative neurological ROS  negative psych ROS   GI/Hepatic negative GI ROS, Neg liver ROS,,,  Endo/Other  negative endocrine ROS    Renal/GU negative Renal ROS  negative genitourinary   Musculoskeletal negative musculoskeletal ROS (+)    Abdominal   Peds negative pediatric ROS (+)  Hematology negative hematology ROS (+)   Anesthesia Other Findings HTN  Reproductive/Obstetrics negative OB ROS                              Anesthesia Physical Anesthesia Plan  ASA: 3  Anesthesia Plan: General   Post-op Pain Management:    Induction: Intravenous  PONV Risk Score and Plan:   Airway Management Planned: Natural Airway and Nasal Cannula  Additional Equipment:   Intra-op Plan:   Post-operative Plan:   Informed Consent: I have reviewed the patients History and Physical, chart, labs and discussed the procedure including the risks, benefits and alternatives for the proposed anesthesia with the patient or authorized representative who has indicated his/her understanding and acceptance.     Dental Advisory Given  Plan Discussed with: Anesthesiologist, CRNA and Surgeon  Anesthesia Plan Comments: (Patient consented for risks of anesthesia including but not limited to:  - adverse reactions to medications - risk of airway placement if required - damage to eyes, teeth, lips or other oral mucosa - nerve damage due to positioning  - sore throat or hoarseness - Damage to heart,  brain, nerves, lungs, other parts of body or loss of life  Patient voiced understanding and assent.)         Anesthesia Quick Evaluation

## 2024-04-07 ENCOUNTER — Ambulatory Visit
Admission: RE | Admit: 2024-04-07 | Discharge: 2024-04-07 | Disposition: A | Discharge status: Home / Self Care | Attending: Gastroenterology | Admitting: Gastroenterology

## 2024-04-07 ENCOUNTER — Other Ambulatory Visit: Payer: Self-pay

## 2024-04-07 ENCOUNTER — Encounter: Payer: Self-pay | Admitting: Internal Medicine

## 2024-04-07 ENCOUNTER — Ambulatory Visit: Payer: Self-pay | Admitting: Anesthesiology

## 2024-04-07 ENCOUNTER — Encounter
Admission: RE | Disposition: A | Discharge status: Home / Self Care | Payer: Self-pay | Source: Home / Self Care | Attending: Gastroenterology

## 2024-04-07 ENCOUNTER — Encounter: Payer: Self-pay | Admitting: Gastroenterology

## 2024-04-07 DIAGNOSIS — F17210 Nicotine dependence, cigarettes, uncomplicated: Secondary | ICD-10-CM | POA: Diagnosis not present

## 2024-04-07 DIAGNOSIS — Z8249 Family history of ischemic heart disease and other diseases of the circulatory system: Secondary | ICD-10-CM | POA: Diagnosis not present

## 2024-04-07 DIAGNOSIS — F419 Anxiety disorder, unspecified: Secondary | ICD-10-CM | POA: Insufficient documentation

## 2024-04-07 DIAGNOSIS — Z1211 Encounter for screening for malignant neoplasm of colon: Secondary | ICD-10-CM | POA: Diagnosis not present

## 2024-04-07 DIAGNOSIS — F32A Depression, unspecified: Secondary | ICD-10-CM | POA: Insufficient documentation

## 2024-04-07 DIAGNOSIS — I10 Essential (primary) hypertension: Secondary | ICD-10-CM | POA: Insufficient documentation

## 2024-04-07 DIAGNOSIS — F17290 Nicotine dependence, other tobacco product, uncomplicated: Secondary | ICD-10-CM | POA: Diagnosis not present

## 2024-04-07 DIAGNOSIS — K5730 Diverticulosis of large intestine without perforation or abscess without bleeding: Secondary | ICD-10-CM | POA: Insufficient documentation

## 2024-04-07 HISTORY — PX: COLONOSCOPY: SHX5424

## 2024-04-07 SURGERY — COLONOSCOPY
Anesthesia: General | Site: Rectum

## 2024-04-07 MED ORDER — PROPOFOL 10 MG/ML IV BOLUS
INTRAVENOUS | Status: DC | PRN
  Administered 2024-04-07 (×2): 80 mg via INTRAVENOUS
  Administered 2024-04-07 (×3): 40 mg via INTRAVENOUS
  Administered 2024-04-07: 60 mg via INTRAVENOUS

## 2024-04-07 MED ORDER — LIDOCAINE HCL (CARDIAC) PF 100 MG/5ML IV SOSY
PREFILLED_SYRINGE | INTRAVENOUS | Status: DC | PRN
  Administered 2024-04-07: 50 mg via INTRAVENOUS

## 2024-04-07 MED ORDER — STERILE WATER FOR IRRIGATION IR SOLN
Status: DC | PRN
  Administered 2024-04-07: 50 mL

## 2024-04-07 MED ORDER — PROPOFOL 10 MG/ML IV BOLUS
INTRAVENOUS | Status: AC
Start: 2024-04-07 — End: ?
  Filled 2024-04-07: qty 40

## 2024-04-07 SURGICAL SUPPLY — 4 items
GOWN CVR UNV OPN BCK APRN NK (MISCELLANEOUS) ×2 IMPLANT
KIT PRC NS LF DISP ENDO (KITS) ×1 IMPLANT
MANIFOLD NEPTUNE II (INSTRUMENTS) ×1 IMPLANT
WATER STERILE IRR 250ML POUR (IV SOLUTION) ×1 IMPLANT

## 2024-04-07 NOTE — Anesthesia Postprocedure Evaluation (Signed)
 Anesthesia Post Note  Patient: Jasmine Miller  Procedure(s) Performed: COLONOSCOPY (Rectum)  Patient location during evaluation: PACU Anesthesia Type: General Level of consciousness: awake and alert Pain management: pain level controlled Vital Signs Assessment: post-procedure vital signs reviewed and stable Respiratory status: spontaneous breathing, nonlabored ventilation, respiratory function stable and patient connected to nasal cannula oxygen Cardiovascular status: blood pressure returned to baseline and stable Postop Assessment: no apparent nausea or vomiting Anesthetic complications: no   No notable events documented.   Last Vitals:  Vitals:   04/07/24 0955 04/07/24 1008  BP: (!) 96/52 104/65  Pulse: 66 66  Resp: 20   Temp: 36.7 C 36.7 C  SpO2: 100% 100%    Last Pain:  Vitals:   04/07/24 1008  TempSrc:   PainSc: 0-No pain                 Timiya Howells C Tyreonna Czaplicki

## 2024-04-07 NOTE — Op Note (Signed)
 [facility] Gastroenterology Patient Name: Jasmine Miller Procedure Date: 04/07/2024 9:22 AM MRN: [id number] Account #: [id number] Date of Birth: 04-Nov-1976 Admit Type: Outpatient Age: 48 Room: [facility] OR ROOM 01 Gender: Female Note Status: Finalized Instrument Name: [id number] Procedure:             Colonoscopy Indications:           Screening for colorectal malignant neoplasm, This is                         the patient's first colonoscopy Providers:             Toney Reil MD, MD Referring MD:          Toney Reil MD, MD (Referring MD), Lorre Munroe (Referring MD) Medicines:             General Anesthesia Complications:         No immediate complications. Estimated blood loss: None. Procedure:             Pre-Anesthesia Assessment:                        - Prior to the procedure, a History and Physical was                         performed, and patient medications and allergies were                         reviewed. The patient is competent. The risks and                         benefits of the procedure and the sedation options and                         risks were discussed with the patient. All questions                         were answered and informed consent was obtained.                         Patient identification and proposed procedure were                         verified by the physician, the nurse, the                         anesthesiologist, the anesthetist and the technician                         in the pre-procedure area in the procedure room in the                         endoscopy suite. Mental Status Examination: alert and                         oriented. Airway Examination: normal oropharyngeal  airway and neck mobility. Respiratory Examination:                         clear to auscultation. CV Examination: normal.                         Prophylactic Antibiotics: The  patient does not require                         prophylactic antibiotics. Prior Anticoagulants: The                         patient has taken no anticoagulant or antiplatelet                         agents. ASA Grade Assessment: III - A patient with                         severe systemic disease. After reviewing the risks and                         benefits, the patient was deemed in satisfactory                         condition to undergo the procedure. The anesthesia                         plan was to use general anesthesia. Immediately prior                         to administration of medications, the patient was                         re-assessed for adequacy to receive sedatives. The                         heart rate, respiratory rate, oxygen saturations,                         blood pressure, adequacy of pulmonary ventilation, and                         response to care were monitored throughout the                         procedure. The physical status of the patient was                         re-assessed after the procedure.                        After obtaining informed consent, the colonoscope was                         passed under direct vision. Throughout the procedure,                         the patient's blood pressure, pulse, and oxygen  saturations were monitored continuously. The                         Colonoscope was introduced through the anus and                         advanced to the the cecum, identified by appendiceal                         orifice and ileocecal valve. The colonoscopy was                         performed without difficulty. The patient tolerated                         the procedure well. The quality of the bowel                         preparation was evaluated using the BBPS [facility] Bowel                         Preparation Scale) with scores of: Right Colon = 3,                         Transverse Colon = 3  and Left Colon = 3 (entire mucosa                         seen well with no residual staining, small fragments                         of stool or opaque liquid). The total BBPS score                         equals 9. The ileocecal valve, appendiceal orifice,                         and rectum were photographed. Findings:      The perianal and digital rectal examinations were normal. Pertinent       negatives include normal sphincter tone and no palpable rectal lesions.      Multiple medium-mouthed diverticula were found in the recto-sigmoid       colon.      The retroflexed view of the distal rectum and anal verge was normal and       showed no anal or rectal abnormalities.      The exam was otherwise without abnormality. Impression:            - Diverticulosis in the recto-sigmoid colon.                        - The distal rectum and anal verge are normal on                         retroflexion view.                        - The examination was otherwise normal.                        -  No specimens collected. Recommendation:        - Discharge patient to home (with escort).                        - Resume previous diet today.                        - Continue present medications.                        - Repeat colonoscopy in 10 years for screening                         purposes. Procedure Code(s):     --- Professional ---                        G2952, Colorectal cancer screening; colonoscopy on                         individual not meeting criteria for high risk Diagnosis Code(s):     --- Professional ---                        Z12.11, Encounter for screening for malignant neoplasm                         of colon                        K57.30, Diverticulosis of large intestine without                         perforation or abscess without bleeding CPT copyright 2022 American Medical Association. All rights reserved. The codes documented in this report are preliminary and upon  coder review may  be revised to meet current compliance requirements. Dr. Libby Maw Toney Reil MD, MD 04/07/2024 9:53:39 AM This report has been signed electronically. Number of Addenda: 0 Note Initiated On: 04/07/2024 9:22 AM Scope Withdrawal Time: 0 hours 8 minutes 15 seconds  Total Procedure Duration: 0 hours 14 minutes 50 seconds  Estimated Blood Loss:  Estimated blood loss: none.      [facility]

## 2024-04-07 NOTE — Transfer of Care (Signed)
 Immediate Anesthesia Transfer of Care Note  Patient: Jasmine Miller  Procedure(s) Performed: COLONOSCOPY (Rectum)  Patient Location: PACU  Anesthesia Type: General  Level of Consciousness: awake, alert  and patient cooperative  Airway and Oxygen Therapy: Patient Spontanous Breathing and Patient connected to supplemental oxygen  Post-op Assessment: Post-op Vital signs reviewed, Patient's Cardiovascular Status Stable, Respiratory Function Stable, Patent Airway and No signs of Nausea or vomiting  Post-op Vital Signs: Reviewed and stable  Complications: No notable events documented.

## 2024-04-07 NOTE — H&P (Signed)
 Arlyss Repress, MD [address]  [address]  [place], [place] [address]  Main: [phone]  Fax: [phone] Pager: [phone]  Primary Care Physician:  Lorre Munroe, NP Primary Gastroenterologist:  Dr. Arlyss Repress  Pre-Procedure History & Physical: HPI:  Jasmine Miller is a 48 y.o. female is here for an colonoscopy.   Past Medical History:  Diagnosis Date   Hypertension     Past Surgical History:  Procedure Laterality Date   CHOLECYSTECTOMY N/A 06/18/2016   Procedure: LAPAROSCOPIC CHOLECYSTECTOMY WITH INTRAOPERATIVE CHOLANGIOGRAM;  Surgeon: Gladis Riffle, MD;  Location: [facility] ORS;  Service: General;  Laterality: N/A;   PARTIAL HYSTERECTOMY  2005    Prior to Admission medications   Medication Sig Start Date End Date Taking? Authorizing Provider  amLODipine (NORVASC) 5 MG tablet Take 1 tablet (5 mg total) by mouth daily. OFFICE VISIT NEEDED FOR ADDITIONAL REFILLS 02/11/24  Yes Lorre Munroe, NP  BLACK COHOSH PO Take by mouth.   Yes [provider]  clonazePAM (KLONOPIN) 0.5 MG tablet Take 1 tablet (0.5 mg total) by mouth daily as needed for anxiety. 02/11/24  Yes Baity, Salvadore Oxford, NP  fluticasone (FLONASE) 50 MCG/ACT nasal spray Place 2 sprays into both nostrils daily. Use for 4-6 weeks then stop and use seasonally or as needed. 11/17/22  Yes Karamalegos, Netta Neat, DO  losartan (COZAAR) 100 MG tablet TAKE 1 TABLET BY MOUTH EVERY DAY 01/28/24  Yes Baity, Salvadore Oxford, NP  Multiple Vitamin (MULTIVITAMIN) tablet Take 1 tablet by mouth daily. "[place]"   Yes [provider]  TIRZEPATIDE [place] Inject into the skin. Tuesdays   Yes [provider]  traZODone (DESYREL) 50 MG tablet TAKE 1 TABLET BY MOUTH EVERYDAY AT BEDTIME 01/28/24  Yes Baity, Salvadore Oxford, NP  atorvastatin (LIPITOR) 20 MG tablet Take 1 tablet (20 mg total) by mouth daily. Patient not taking: Reported on 03/29/2024 02/14/24   Lorre Munroe, NP    Allergies as of 02/29/2024 -  Review Complete 02/11/2024  Allergen Reaction Noted   Chantix [varenicline tartrate] Swelling 01/12/2019   Penicillins Anaphylaxis 06/17/2016    Family History  Problem Relation Age of Onset   Diabetes Mother    Hypertension Mother    Healthy Brother    Breast cancer Maternal Grandmother    Breast cancer Maternal Aunt    Ovarian cancer Maternal Aunt    Breast cancer Maternal Aunt     Social History   Socioeconomic History   Marital status: Single    Spouse name: Not on file   Number of children: Not on file   Years of education: Not on file   Highest education level: Not on file  Occupational History   Not on file  Tobacco Use   Smoking status: Some Days    Current packs/day: 0.00    Average packs/day: 1 pack/day for 15.0 years (15.0 ttl pk-yrs)    Types: E-cigarettes, Cigarettes    Start date: 2005    Last attempt to quit: 2020    Years since quitting: 5.2   Smokeless tobacco: Never   Tobacco comments:    only smokes E-Cigarettes  Vaping Use   Vaping status: Every Day   Substances: Nicotine, Flavoring   Devices: Novo.  Refillable  Substance and Sexual Activity   Alcohol use: Not Currently   Drug use: No   Sexual activity: Not on file  Other Topics Concern   Not on file  Social History Narrative   Not  on file   Social Drivers of Health   Financial Resource Strain: Not on file  Food Insecurity: Not on file  Transportation Needs: Not on file  Physical Activity: Not on file  Stress: Not on file  Social Connections: Not on file  Intimate Partner Violence: Not on file    Review of Systems: See HPI, otherwise negative ROS  Physical Exam: BP 133/86   Temp 98.2 F (36.8 C) (Temporal)   Ht 5' 2.99" (1.6 m)   Wt 76.7 kg   SpO2 98%   BMI 29.96 kg/m  General:   Alert,  pleasant and cooperative in NAD Head:  Normocephalic and atraumatic. Neck:  Supple; no masses or thyromegaly. Lungs:  Clear throughout to auscultation.    Heart:  Regular rate and  rhythm. Abdomen:  Soft, nontender and nondistended. Normal bowel sounds, without guarding, and without rebound.   Neurologic:  Alert and  oriented x4;  grossly normal neurologically.  Impression/Plan: Jasmine Miller is here for an colonoscopy to be performed for colon cancer screening  Risks, benefits, limitations, and alternatives regarding  colonoscopy have been reviewed with the patient.  Questions have been answered.  All parties agreeable.   Lannette Donath, MD  04/07/2024, 9:25 AM

## 2024-04-24 ENCOUNTER — Other Ambulatory Visit: Payer: Self-pay | Admitting: Internal Medicine

## 2024-04-24 DIAGNOSIS — I10 Essential (primary) hypertension: Secondary | ICD-10-CM

## 2024-04-25 NOTE — Telephone Encounter (Signed)
 Requested Prescriptions  Pending Prescriptions Disp Refills   traZODone  (DESYREL ) 50 MG tablet [Pharmacy Med Name: TRAZODONE  50 MG TABLET] 30 tablet 2    Sig: TAKE 1 TABLET BY MOUTH EVERYDAY AT BEDTIME     Psychiatry: Antidepressants - Serotonin Modulator Passed - 04/25/2024  3:31 PM      Passed - Completed PHQ-2 or PHQ-9 in the last 360 days      Passed - Valid encounter within last 6 months    Recent Outpatient Visits           2 months ago Encounter for general adult medical examination with abnormal findings   [place] [facility] [place], Rankin Buzzard, NP       Future Appointments             In 3 months Baity, Rankin Buzzard, NP [facility], [facility]             losartan  (COZAAR ) 100 MG tablet [Pharmacy Med Name: LOSARTAN  POTASSIUM 100 MG TAB] 30 tablet 2    Sig: TAKE 1 TABLET BY MOUTH EVERY DAY     Cardiovascular:  Angiotensin Receptor Blockers Passed - 04/25/2024  3:31 PM      Passed - Cr in normal range and within 180 days    Creat  Date Value Ref Range Status  02/11/2024 0.65 0.50 - 0.99 mg/dL Final         Passed - K in normal range and within 180 days    Potassium  Date Value Ref Range Status  02/11/2024 3.8 3.5 - 5.3 mmol/L Final  07/17/2014 3.6 3.5 - 5.1 mmol/L Final         Passed - Patient is not pregnant      Passed - Last BP in normal range    BP Readings from Last 1 Encounters:  04/07/24 104/65         Passed - Valid encounter within last 6 months    Recent Outpatient Visits           2 months ago Encounter for general adult medical examination with abnormal findings   [place] [place], Rankin Buzzard, NP       Future Appointments             In 3 months Baity, Rankin Buzzard, NP [facility], [facility]

## 2024-05-09 ENCOUNTER — Encounter

## 2024-05-09 ENCOUNTER — Telehealth: Admitting: Physician Assistant

## 2024-05-09 DIAGNOSIS — A084 Viral intestinal infection, unspecified: Secondary | ICD-10-CM | POA: Diagnosis not present

## 2024-05-09 MED ORDER — ONDANSETRON 4 MG PO TBDP: 4.0000 mg | ORAL_TABLET | Freq: Three times a day (TID) | ORAL | 0 refills | Status: DC | PRN

## 2024-05-09 NOTE — Progress Notes (Signed)
 Virtual Visit Consent   Jasmine Miller, you are scheduled for a virtual visit with a [facility] provider today. Just as with appointments in the office, your consent must be obtained to participate. Your consent will be active for this visit and any virtual visit you may have with one of our providers in the next 365 days. If you have a MyChart account, a copy of this consent can be sent to you electronically.  As this is a virtual visit, video technology does not allow for your provider to perform a traditional examination. This may limit your provider's ability to fully assess your condition. If your provider identifies any concerns that need to be evaluated in person or the need to arrange testing (such as labs, EKG, etc.), we will make arrangements to do so. Although advances in technology are sophisticated, we cannot ensure that it will always work on either your end or our end. If the connection with a video visit is poor, the visit may have to be switched to a telephone visit. With either a video or telephone visit, we are not always able to ensure that we have a secure connection.  By engaging in this virtual visit, you consent to the provision of healthcare and authorize for your insurance to be billed (if applicable) for the services provided during this visit. Depending on your insurance coverage, you may receive a charge related to this service.  I need to obtain your verbal consent now. Are you willing to proceed with your visit today? Jasmine Miller has provided verbal consent on 05/09/2024 for a virtual visit (video or telephone). Hyla Maillard, [place]  Date: 05/09/2024 10:31 AM   Virtual Visit via Video Note   I, Hyla Maillard, connected with  Jasmine Miller  ([id number], [birth year]) on 05/09/24 at 10:30 AM EDT by a video-enabled telemedicine application and verified that I am speaking with the correct person using two identifiers.  Location: Patient: Virtual Visit  Location Patient: Home Provider: Virtual Visit Location Provider: Home Office   I discussed the limitations of evaluation and management by telemedicine and the availability of in person appointments. The patient expressed understanding and agreed to proceed.    History of Present Illness: Jasmine Miller is a 48 y.o. who identifies as a female who was assigned female at birth, and is being seen today for 2 days of substantial nausea, diarrhea, intermittent fever and headache.  Endorses symptoms starting Sunday morning.  Does note she has some occasional GI upset so initially did not think much about it, but symptoms continue to progress.  Denies any recent travel or known sick contacts.  Denies abnormal food source.  Stool is frequent and watery but without melena or hematochezia.  Nausea is substantial at times but no vomiting.  Endorses Tmax was 101.4, but improved now.  Does currently take tirzepatide once weekly, but last dose was greater than 8 days ago at this point.  HPI: HPI  Problems:  Patient Active Problem List   Diagnosis Date Noted   Colon cancer screening 04/07/2024   Insomnia 10/13/2022   HLD (hyperlipidemia) 06/12/2021   Prediabetes 06/12/2021   Class 1 obesity due to excess calories with body mass index (BMI) of 32.0 to 32.9 in adult 06/12/2021   Psoriasis 09/21/2019   Anxiety and depression 12/23/2017   Essential (primary) hypertension 12/13/2014    Allergies:  Allergies  Allergen Reactions   Chantix  [Varenicline  Tartrate] Swelling    Lip swelling and rash   Penicillins Anaphylaxis  Medications:  Current Outpatient Medications:    ondansetron  (ZOFRAN -ODT) 4 MG disintegrating tablet, Take 1 tablet (4 mg total) by mouth every 8 (eight) hours as needed for nausea or vomiting., Disp: 20 tablet, Rfl: 0   amLODipine  (NORVASC ) 5 MG tablet, Take 1 tablet (5 mg total) by mouth daily. OFFICE VISIT NEEDED FOR ADDITIONAL REFILLS, Disp: 90 tablet, Rfl: 1   atorvastatin   (LIPITOR) 20 MG tablet, Take 1 tablet (20 mg total) by mouth daily. (Patient not taking: Reported on 03/29/2024), Disp: 90 tablet, Rfl: 3   BLACK COHOSH PO, Take by mouth., Disp: , Rfl:    clonazePAM  (KLONOPIN ) 0.5 MG tablet, Take 1 tablet (0.5 mg total) by mouth daily as needed for anxiety., Disp: 30 tablet, Rfl: 0   losartan  (COZAAR ) 100 MG tablet, TAKE 1 TABLET BY MOUTH EVERY DAY, Disp: 30 tablet, Rfl: 2   Multiple Vitamin (MULTIVITAMIN) tablet, Take 1 tablet by mouth daily. "Johnita Nails", Disp: , Rfl:    TIRZEPATIDE [place], Inject into the skin. Tuesdays, Disp: , Rfl:    traZODone  (DESYREL ) 50 MG tablet, TAKE 1 TABLET BY MOUTH EVERYDAY AT BEDTIME, Disp: 30 tablet, Rfl: 2  Observations/Objective: Patient is well-developed, well-nourished in no acute distress.  Resting comfortably at home.  Head is normocephalic, atraumatic.  No labored breathing. Speech is clear and coherent with logical content.  Patient is alert and oriented at baseline.   Assessment and Plan: 1. Viral gastroenteritis (Primary) - ondansetron  (ZOFRAN -ODT) 4 MG disintegrating tablet; Take 1 tablet (4 mg total) by mouth every 8 (eight) hours as needed for nausea or vomiting.  Dispense: 20 tablet; Refill: 0  Initial fever that is resolving.  No bloody stool or other alarm signs or symptoms.  No abnormal food source or contact to his having symptoms that have the same foods.  Suspect viral etiology.  Supportive measures and OTC medications reviewed.  Zofran  per orders.  Start brat diet.  Strict ER precautions reviewed with patient.  Work note provided.  Follow Up Instructions: I discussed the assessment and treatment plan with the patient. The patient was provided an opportunity to ask questions and all were answered. The patient agreed with the plan and demonstrated an understanding of the instructions.  A copy of instructions were sent to the patient via MyChart unless otherwise noted below.   The patient was advised to call back  or seek an in-person evaluation if the symptoms worsen or if the condition fails to improve as anticipated.    Hyla Maillard, PA-C

## 2024-05-09 NOTE — Patient Instructions (Signed)
 Jasmine Miller, thank you for joining Hyla Maillard, PA-C for today's virtual visit.  While this provider is not your primary care provider (PCP), if your PCP is located in our provider database this encounter information will be shared with them immediately following your visit.   A [facility] MyChart account gives you access to today's visit and all your visits, tests, and labs performed at [facility] " click here if you don't have a [facility] MyChart account or go to [email or web address]  Consent: (Patient) Kiamesha Holihan provided verbal consent for this virtual visit at the beginning of the encounter.  Current Medications:  Current Outpatient Medications:    amLODipine  (NORVASC ) 5 MG tablet, Take 1 tablet (5 mg total) by mouth daily. OFFICE VISIT NEEDED FOR ADDITIONAL REFILLS, Disp: 90 tablet, Rfl: 1   atorvastatin  (LIPITOR) 20 MG tablet, Take 1 tablet (20 mg total) by mouth daily. (Patient not taking: Reported on 03/29/2024), Disp: 90 tablet, Rfl: 3   BLACK COHOSH PO, Take by mouth., Disp: , Rfl:    clonazePAM  (KLONOPIN ) 0.5 MG tablet, Take 1 tablet (0.5 mg total) by mouth daily as needed for anxiety., Disp: 30 tablet, Rfl: 0   fluticasone  (FLONASE ) 50 MCG/ACT nasal spray, Place 2 sprays into both nostrils daily. Use for 4-6 weeks then stop and use seasonally or as needed., Disp: 16 g, Rfl: 3   losartan  (COZAAR ) 100 MG tablet, TAKE 1 TABLET BY MOUTH EVERY DAY, Disp: 30 tablet, Rfl: 2   Multiple Vitamin (MULTIVITAMIN) tablet, Take 1 tablet by mouth daily. "Johnita Nails", Disp: , Rfl:    TIRZEPATIDE [place], Inject into the skin. Tuesdays, Disp: , Rfl:    traZODone  (DESYREL ) 50 MG tablet, TAKE 1 TABLET BY MOUTH EVERYDAY AT BEDTIME, Disp: 30 tablet, Rfl: 2   Medications ordered in this encounter:  No orders of the defined types were placed in this encounter.    *If you need refills on other medications prior to your next appointment, please contact your  pharmacy*  Follow-Up: Call back or seek an in-person evaluation if the symptoms worsen or if the condition fails to improve as anticipated.  [facility] [phone]  Other Instructions Viral Gastroenteritis, Adult  Viral gastroenteritis is also known as the stomach flu. This condition may affect your stomach, your small intestine, and your large intestine. It can cause sudden watery poop (diarrhea), fever, and vomiting. This condition is caused by certain germs (viruses). These germs can be passed from person to person very easily (are contagious). Having watery poop and vomiting can make you feel weak and cause you to not have enough water  in your body (get dehydrated). This can make you tired and thirsty, make you have a dry mouth, and make it so you pee (urinate) less often. It is important to replace the fluids that you lose from having watery poop and vomiting. What are the causes? You can get sick by catching germs from other people. You can also get sick by: Eating food, drinking water , or touching a surface that has the germs on it (is contaminated). Sharing utensils or other personal items with a person who is sick. What increases the risk? Having a weak body defense system (immune system). Living with one or more children who are younger than 2 years. Living in a nursing home. Going on cruise ships. What are the signs or symptoms? Symptoms of this condition start suddenly. Symptoms may last for a few days or for as long as a  week. Common symptoms include: Watery poop. Vomiting. Other symptoms include: Fever. Headache. Feeling tired (fatigue). Pain in the belly (abdomen). Chills. Feeling weak. Feeling like you may vomit (nauseous). Muscle aches. Not feeling hungry. How is this treated? This condition typically goes away on its own. The focus of treatment is to replace the fluids that you lose. This condition may be treated with: An ORS (oral rehydration  solution). This is a drink that helps you replace fluids and minerals your body lost. It is sold at pharmacies and stores. Medicines to help with your symptoms. Probiotic supplements to reduce symptoms of watery poop. Fluids given through an IV tube, if needed. Older adults and people with other diseases or a weak body defense system are at higher risk for not having enough water  in the body. Follow these instructions at home: Eating and drinking  Take an ORS as told by your doctor. Drink clear fluids in small amounts as you are able. Clear fluids include: Water . Ice chips. Fruit juice that has water  added to it (is diluted). Low-calorie sports drinks. Drink enough fluid to keep your pee (urine) pale yellow. Eat small amounts of healthy foods every 3-4 hours as you are able. This may include whole grains, fruits, vegetables, lean meats, and yogurt. Avoid fluids that have a lot of sugar or caffeine in them. This includes energy drinks, sports drinks, and soda. Avoid spicy or fatty foods. Avoid alcohol. General instructions  Wash your hands often. This is very important after you have watery poop or you vomit. If you cannot use soap and water , use hand sanitizer. Make sure that all people in your home wash their hands well and often. Take over-the-counter and prescription medicines only as told by your doctor. Rest at home while you get better. Watch your condition for any changes. Take a warm bath to help with any burning or pain from having watery poop. Keep all follow-up visits. Contact a doctor if: You cannot keep fluids down. Your symptoms get worse. You have new symptoms. You feel light-headed or dizzy. You have muscle cramps. Get help right away if: You have chest pain. You have trouble breathing, or you are breathing very fast. You have a fast heartbeat. You feel very weak or you faint. You have a very bad headache, a stiff neck, or both. You have a rash. You have very  bad pain, cramping, or bloating in your belly. Your skin feels cold and clammy. You feel mixed up (confused). You have pain when you pee. You have signs of not having enough water  in the body, such as: Dark pee, hardly any pee, or no pee. Cracked lips. Dry mouth. Sunken eyes. Feeling very sleepy. Feeling weak. You have signs of bleeding, such as: You see blood in your vomit. Your vomit looks like coffee grounds. You have bloody or black poop or poop that looks like tar. These symptoms may be an emergency. Get help right away. Call 911. Do not wait to see if the symptoms will go away. Do not drive yourself to the hospital. Summary Viral gastroenteritis is also known as the stomach flu. This condition can cause sudden watery poop (diarrhea), fever, and vomiting. These germs can be passed from person to person very easily. Take an ORS (oral rehydration solution) as told by your doctor. This is a drink that is sold at pharmacies and stores. Wash your hands often, especially after having watery poop or vomiting. If you cannot use soap and water , use hand sanitizer.  This information is not intended to replace advice given to you by your health care provider. Make sure you discuss any questions you have with your health care provider. Document Revised: 10/13/2021 Document Reviewed: 10/13/2021 Elsevier Patient Education  2024 Elsevier Inc.   If you have been instructed to have an in-person evaluation today at a local Urgent Care facility, please use the link below. It will take you to a list of all of our available [facility], including address, phone number and hours of operation. Please do not delay care.  [facility]  If you or a family member do not have a primary care provider, use the link below to schedule a visit and establish care. When you choose a [facility] primary care physician or advanced practice provider, you gain a long-term partner in  health. Find a Primary Care Provider  Learn more about [facility]'s in-office and virtual care options: [facility] - Get Care Now

## 2024-05-19 ENCOUNTER — Other Ambulatory Visit: Payer: Self-pay | Admitting: Internal Medicine

## 2024-05-23 NOTE — Telephone Encounter (Signed)
 Requested medications are due for refill today.  yes  Requested medications are on the active medications list.  yes  Last refill. 02/11/2024 #30 0 rf  Future visit scheduled.   yes  Notes to clinic.  Refill not delegated.    Requested Prescriptions  Pending Prescriptions Disp Refills   clonazePAM  (KLONOPIN ) 0.5 MG tablet [Pharmacy Med Name: CLONAZEPAM  0.5 MG TABLET] 30 tablet     Sig: TAKE 1 TABLET BY MOUTH EVERY DAY AS NEEDED FOR ANXIETY     Not Delegated - Psychiatry: Anxiolytics/Hypnotics 2 Failed - 05/23/2024  3:18 PM      Failed - This refill cannot be delegated      Failed - Urine Drug Screen completed in last 360 days      Passed - Patient is not pregnant      Passed - Valid encounter within last 6 months    Recent Outpatient Visits           3 months ago Encounter for general adult medical examination with abnormal findings   [place] [facility] [place], Rankin Buzzard, NP       Future Appointments             In 2 months Baity, Rankin Buzzard, NP [facility], [facility]

## 2024-07-23 ENCOUNTER — Other Ambulatory Visit: Payer: Self-pay | Admitting: Internal Medicine

## 2024-07-23 DIAGNOSIS — I10 Essential (primary) hypertension: Secondary | ICD-10-CM

## 2024-07-24 NOTE — Telephone Encounter (Signed)
 Requested Prescriptions  Pending Prescriptions Disp Refills   traZODone  (DESYREL ) 50 MG tablet [Pharmacy Med Name: TRAZODONE  50 MG TABLET] 30 tablet 2    Sig: TAKE 1 TABLET BY MOUTH EVERYDAY AT BEDTIME     Psychiatry: Antidepressants - Serotonin Modulator Passed - 07/24/2024  5:33 PM      Passed - Completed PHQ-2 or PHQ-9 in the last 360 days      Passed - Valid encounter within last 6 months    Recent Outpatient Visits           5 months ago Encounter for general adult medical examination with abnormal findings   [facility] [place], Angeline ORN, NP       Future Appointments             In 2 weeks Antonette, Angeline ORN, NP [place] [facility], [facility]             losartan  (COZAAR ) 100 MG tablet [Pharmacy Med Name: LOSARTAN  POTASSIUM 100 MG TAB] 30 tablet 2    Sig: TAKE 1 TABLET BY MOUTH EVERY DAY     Cardiovascular:  Angiotensin Receptor Blockers Passed - 07/24/2024  5:33 PM      Passed - Cr in normal range and within 180 days    Creat  Date Value Ref Range Status  02/11/2024 0.65 0.50 - 0.99 mg/dL Final         Passed - K in normal range and within 180 days    Potassium  Date Value Ref Range Status  02/11/2024 3.8 3.5 - 5.3 mmol/L Final  07/17/2014 3.6 3.5 - 5.1 mmol/L Final         Passed - Patient is not pregnant      Passed - Last BP in normal range    BP Readings from Last 1 Encounters:  04/07/24 104/65         Passed - Valid encounter within last 6 months    Recent Outpatient Visits           5 months ago Encounter for general adult medical examination with abnormal findings   [facility] [place], Angeline ORN, NP       Future Appointments             In 2 weeks Antonette, Angeline ORN, NP [facility], [facility]             clonazePAM  (KLONOPIN ) 0.5 MG tablet [Pharmacy Med Name: CLONAZEPAM  0.5 MG TABLET] 30 tablet 0    Sig: TAKE 1 TABLET BY MOUTH EVERY DAY AS NEEDED FOR  ANXIETY     Not Delegated - Psychiatry: Anxiolytics/Hypnotics 2 Failed - 07/24/2024  5:33 PM      Failed - This refill cannot be delegated      Failed - Urine Drug Screen completed in last 360 days      Passed - Patient is not pregnant      Passed - Valid encounter within last 6 months    Recent Outpatient Visits           5 months ago Encounter for general adult medical examination with abnormal findings   [place] [facility] [facility] [place], Angeline ORN, NP       Future Appointments             In 2 weeks Antonette, Angeline ORN, NP [facility], [facility]

## 2024-07-24 NOTE — Telephone Encounter (Signed)
 Requested medications are due for refill today.  yes  Requested medications are on the active medications list.  yes  Last refill. 05/24/2024 #30 0 rf  Future visit scheduled.   yes  Notes to clinic.  Refill not delegated.    Requested Prescriptions  Pending Prescriptions Disp Refills   clonazePAM  (KLONOPIN ) 0.5 MG tablet [Pharmacy Med Name: CLONAZEPAM  0.5 MG TABLET] 30 tablet 0    Sig: TAKE 1 TABLET BY MOUTH EVERY DAY AS NEEDED FOR ANXIETY     Not Delegated - Psychiatry: Anxiolytics/Hypnotics 2 Failed - 07/24/2024  5:33 PM      Failed - This refill cannot be delegated      Failed - Urine Drug Screen completed in last 360 days      Passed - Patient is not pregnant      Passed - Valid encounter within last 6 months    Recent Outpatient Visits           5 months ago Encounter for general adult medical examination with abnormal findings   [facility] [place], Angeline ORN, NP       Future Appointments             In 2 weeks Wallace, Angeline ORN, NP [facility]            Signed Prescriptions Disp Refills   traZODone  (DESYREL ) 50 MG tablet 30 tablet 2    Sig: TAKE 1 TABLET BY MOUTH EVERYDAY AT BEDTIME     Psychiatry: Antidepressants - Serotonin Modulator Passed - 07/24/2024  5:33 PM      Passed - Completed PHQ-2 or PHQ-9 in the last 360 days      Passed - Valid encounter within last 6 months    Recent Outpatient Visits           5 months ago Encounter for general adult medical examination with abnormal findings   [place] [facility] [place], Angeline ORN, NP       Future Appointments             In 2 weeks Antonette, Angeline ORN, NP  [facility]             losartan  (COZAAR ) 100 MG tablet 30 tablet 2    Sig: TAKE 1 TABLET BY MOUTH EVERY DAY     Cardiovascular:  Angiotensin Receptor Blockers Passed - 07/24/2024  5:33 PM      Passed - Cr in normal range and within  180 days    Creat  Date Value Ref Range Status  02/11/2024 0.65 0.50 - 0.99 mg/dL Final         Passed - K in normal range and within 180 days    Potassium  Date Value Ref Range Status  02/11/2024 3.8 3.5 - 5.3 mmol/L Final  07/17/2014 3.6 3.5 - 5.1 mmol/L Final         Passed - Patient is not pregnant      Passed - Last BP in normal range    BP Readings from Last 1 Encounters:  04/07/24 104/65         Passed - Valid encounter within last 6 months    Recent Outpatient Visits           5 months ago Encounter for general adult medical examination with abnormal findings   [facility] [place], Angeline ORN, NP  Future Appointments             In 2 weeks Baity, Angeline ORN, NP [facility]

## 2024-08-11 ENCOUNTER — Ambulatory Visit: Payer: BLUE CROSS/BLUE SHIELD | Admitting: Internal Medicine

## 2024-08-25 ENCOUNTER — Ambulatory Visit: Admitting: Internal Medicine

## 2024-08-25 ENCOUNTER — Encounter: Payer: Self-pay | Admitting: Internal Medicine

## 2024-08-25 NOTE — Progress Notes (Deleted)
 Virtual Visit via Video Note  I connected with Jasmine Miller on 08/25/24 at 11:00 AM EDT by a video enabled telemedicine application and verified that I am speaking with the correct person using two identifiers.  Location: Patient: Home Provider: Office  Person's participating in this video call: Angeline Laura NP and Jasmine Miller.   I discussed the limitations of evaluation and management by telemedicine and the availability of in person appointments. The patient expressed understanding and agreed to proceed.  History of Present Illness:     Past Medical History:  Diagnosis Date   Hypertension     Current Outpatient Medications  Medication Sig Dispense Refill   amLODipine  (NORVASC ) 5 MG tablet Take 1 tablet (5 mg total) by mouth daily. OFFICE VISIT NEEDED FOR ADDITIONAL REFILLS 90 tablet 1   atorvastatin  (LIPITOR) 20 MG tablet Take 1 tablet (20 mg total) by mouth daily. (Patient not taking: Reported on 03/29/2024) 90 tablet 3   BLACK COHOSH PO Take by mouth.     clonazePAM  (KLONOPIN ) 0.5 MG tablet TAKE 1 TABLET BY MOUTH EVERY DAY AS NEEDED FOR ANXIETY 30 tablet 0   losartan  (COZAAR ) 100 MG tablet TAKE 1 TABLET BY MOUTH EVERY DAY 30 tablet 2   Multiple Vitamin (MULTIVITAMIN) tablet Take 1 tablet by mouth daily. Ronal Dines     ondansetron  (ZOFRAN -ODT) 4 MG disintegrating tablet Take 1 tablet (4 mg total) by mouth every 8 (eight) hours as needed for nausea or vomiting. 20 tablet 0   TIRZEPATIDE [place] Inject into the skin. Tuesdays     traZODone  (DESYREL ) 50 MG tablet TAKE 1 TABLET BY MOUTH EVERYDAY AT BEDTIME 30 tablet 2   No current facility-administered medications for this visit.    Allergies  Allergen Reactions   Chantix  [Varenicline  Tartrate] Swelling    Lip swelling and rash   Penicillins Anaphylaxis    Family History  Problem Relation Age of Onset   Diabetes Mother    Hypertension Mother    Healthy Brother    Breast cancer Maternal Grandmother    Breast cancer  Maternal Aunt    Ovarian cancer Maternal Aunt    Breast cancer Maternal Aunt     Social History   Socioeconomic History   Marital status: Single    Spouse name: Not on file   Number of children: Not on file   Years of education: Not on file   Highest education level: Not on file  Occupational History   Not on file  Tobacco Use   Smoking status: Some Days    Current packs/day: 0.00    Average packs/day: 1 pack/day for 15.0 years (15.0 ttl pk-yrs)    Types: E-cigarettes, Cigarettes    Start date: 2005    Last attempt to quit: 2020    Years since quitting: 5.6   Smokeless tobacco: Never   Tobacco comments:    only smokes E-Cigarettes  Vaping Use   Vaping status: Every Day   Substances: Nicotine, Flavoring   Devices: Novo.  Refillable  Substance and Sexual Activity   Alcohol use: Not Currently   Drug use: No   Sexual activity: Not on file  Other Topics Concern   Not on file  Social History Narrative   Not on file   Social Drivers of Health   Financial Resource Strain: Not on file  Food Insecurity: Not on file  Transportation Needs: Not on file  Physical Activity: Not on file  Stress: Not on file  Social Connections: Not on file  Intimate Partner Violence: Not on file     Constitutional: Pt reports headache. Denies fever, malaise, fatigue, or abrupt weight changes.  HEENT: Pt reports nasal congestion, sore throat. Denies eye pain, eye redness, ear pain, ringing in the ears, wax buildup, runny nose, bloody nose. Respiratory: Pt reports cough. Denies difficulty breathing, shortness of breath, or sputum production.   Cardiovascular: Denies chest pain, chest tightness, palpitations or swelling in the hands or feet.  Gastrointestinal: Denies abdominal pain, bloating, constipation, diarrhea or blood in the stool.   No other specific complaints in a complete review of systems (except as listed in HPI above).  Observations/Objective:  There were no vitals taken for  this visit. Wt Readings from Last 3 Encounters:  04/07/24 169 lb 1.6 oz (76.7 kg)  02/11/24 185 lb 6.4 oz (84.1 kg)  10/20/23 205 lb (93 kg)    General: Appears her stated age, appears unwell but, in NAD. HEENT: Head: normal shape and size; Nose: congestion noted; Throat/Mouth: hoarseness noted Pulmonary/Chest: Normal effort. No respiratory distress.  Neurological: Alert and oriented.     BMET    Component Value Date/Time   NA 138 02/11/2024 1321   NA 142 04/06/2019 1554   NA 137 07/17/2014 1237   K 3.8 02/11/2024 1321   K 3.6 07/17/2014 1237   CL 102 02/11/2024 1321   CL 105 07/17/2014 1237   CO2 28 02/11/2024 1321   CO2 28 07/17/2014 1237   GLUCOSE 80 02/11/2024 1321   GLUCOSE 103 (H) 07/17/2014 1237   BUN 18 02/11/2024 1321   BUN 13 04/06/2019 1554   BUN 8 07/17/2014 1237   CREATININE 0.65 02/11/2024 1321   CALCIUM  9.3 02/11/2024 1321   CALCIUM  8.5 07/17/2014 1237   GFRNONAA 108 04/06/2019 1554   GFRNONAA >60 07/17/2014 1237   GFRAA 125 04/06/2019 1554   GFRAA >60 07/17/2014 1237    Lipid Panel     Component Value Date/Time   CHOL 184 02/11/2024 1321   TRIG 157 (H) 02/11/2024 1321   HDL 45 (L) 02/11/2024 1321   CHOLHDL 4.1 02/11/2024 1321   VLDL 68.4 (H) 03/13/2021 1036   LDLCALC 112 (H) 02/11/2024 1321    CBC    Component Value Date/Time   WBC 8.4 02/11/2024 1321   RBC 4.68 02/11/2024 1321   HGB 13.1 02/11/2024 1321   HGB 13.5 07/17/2014 1237   HCT 40.5 02/11/2024 1321   HCT 40.2 07/17/2014 1237   PLT 236 02/11/2024 1321   PLT 204 07/17/2014 1237   MCV 86.5 02/11/2024 1321   MCV 89 07/17/2014 1237   MCH 28.0 02/11/2024 1321   MCHC 32.3 02/11/2024 1321   RDW 13.1 02/11/2024 1321   RDW 13.3 07/17/2014 1237    Hgb A1C Lab Results  Component Value Date   HGBA1C 5.2 02/11/2024       Assessment and Plan:  Scheduled appointment for follow-up of chronic conditions  Follow Up Instructions:    I discussed the assessment and treatment  plan with the patient. The patient was provided an opportunity to ask questions and all were answered. The patient agreed with the plan and demonstrated an understanding of the instructions.   The patient was advised to call back or seek an in-person evaluation if the symptoms worsen or if the condition fails to improve as anticipated.    Angeline Laura, NP

## 2024-08-25 NOTE — Telephone Encounter (Signed)
 She would have to be seen first.  I noticed that she canceled her appointment

## 2024-09-06 ENCOUNTER — Encounter: Payer: Self-pay | Admitting: Internal Medicine

## 2024-09-06 ENCOUNTER — Ambulatory Visit (INDEPENDENT_AMBULATORY_CARE_PROVIDER_SITE_OTHER): Admitting: Internal Medicine

## 2024-09-06 ENCOUNTER — Encounter

## 2024-09-06 VITALS — BP 122/78 | Ht 63.0 in | Wt 156.0 lb

## 2024-09-06 DIAGNOSIS — H65191 Other acute nonsuppurative otitis media, right ear: Secondary | ICD-10-CM

## 2024-09-06 MED ORDER — AZITHROMYCIN 250 MG PO TABS: ORAL_TABLET | ORAL | 0 refills | Status: DC

## 2024-09-06 NOTE — Progress Notes (Signed)
 Subjective:    Patient ID: Jasmine Miller, female    DOB: 1976-06-25, 48 y.o.   MRN: [id number]  HPI  Discussed the use of AI scribe software for clinical note transcription with the patient, who gave verbal consent to proceed.  Jasmine Miller is a 48 year old female who presents with ear pain following a recent COVID-19 infection.  She has been experiencing bilateral ear pain for the past couple of weeks, with the right ear being more painful. The pain is described as sore and achy, without sharpness or drainage. She has been using Flonase  for relief.  She occasionally experiences headaches. No other COVID-related symptoms such as runny nose, nasal congestion, sore throat, cough, shortness of breath, fever, chills, or body aches are present.  She is planning to fly to [place] next week to visit her son and is concerned about the ear pain during the flight. She has a known allergy to penicillins.       Review of Systems     Past Medical History:  Diagnosis Date   Hypertension     Current Outpatient Medications  Medication Sig Dispense Refill   amLODipine  (NORVASC ) 5 MG tablet Take 1 tablet (5 mg total) by mouth daily. OFFICE VISIT NEEDED FOR ADDITIONAL REFILLS 90 tablet 1   atorvastatin  (LIPITOR) 20 MG tablet Take 1 tablet (20 mg total) by mouth daily. (Patient not taking: Reported on 03/29/2024) 90 tablet 3   BLACK COHOSH PO Take by mouth.     clonazePAM  (KLONOPIN ) 0.5 MG tablet TAKE 1 TABLET BY MOUTH EVERY DAY AS NEEDED FOR ANXIETY 30 tablet 0   losartan  (COZAAR ) 100 MG tablet TAKE 1 TABLET BY MOUTH EVERY DAY 30 tablet 2   Multiple Vitamin (MULTIVITAMIN) tablet Take 1 tablet by mouth daily. Ronal Dines     ondansetron  (ZOFRAN -ODT) 4 MG disintegrating tablet Take 1 tablet (4 mg total) by mouth every 8 (eight) hours as needed for nausea or vomiting. 20 tablet 0   TIRZEPATIDE [place] Inject into the skin. Tuesdays     traZODone  (DESYREL ) 50 MG tablet TAKE 1 TABLET BY MOUTH  EVERYDAY AT BEDTIME 30 tablet 2   No current facility-administered medications for this visit.    Allergies  Allergen Reactions   Chantix  [Varenicline  Tartrate] Swelling    Lip swelling and rash   Penicillins Anaphylaxis    Family History  Problem Relation Age of Onset   Diabetes Mother    Hypertension Mother    Healthy Brother    Breast cancer Maternal Grandmother    Breast cancer Maternal Aunt    Ovarian cancer Maternal Aunt    Breast cancer Maternal Aunt     Social History   Socioeconomic History   Marital status: Single    Spouse name: Not on file   Number of children: Not on file   Years of education: Not on file   Highest education level: Not on file  Occupational History   Not on file  Tobacco Use   Smoking status: Some Days    Current packs/day: 0.00    Average packs/day: 1 pack/day for 15.0 years (15.0 ttl pk-yrs)    Types: E-cigarettes, Cigarettes    Start date: 2005    Last attempt to quit: 2020    Years since quitting: 5.6   Smokeless tobacco: Never   Tobacco comments:    only smokes E-Cigarettes  Vaping Use   Vaping status: Every Day   Substances: Nicotine, Flavoring   Devices: Novo.  Refillable  Substance and Sexual Activity   Alcohol use: Not Currently   Drug use: No   Sexual activity: Not on file  Other Topics Concern   Not on file  Social History Narrative   Not on file   Social Drivers of Health   Financial Resource Strain: Not on file  Food Insecurity: Not on file  Transportation Needs: Not on file  Physical Activity: Not on file  Stress: Not on file  Social Connections: Not on file  Intimate Partner Violence: Not on file     Constitutional: Pt reports headache. Denies fever, malaise, fatigue, headache or abrupt weight changes.  HEENT: Patient reports ear pain.  Denies eye pain, eye redness, ringing in the ears, wax buildup, runny nose, nasal congestion, bloody nose, or sore throat. Respiratory: Denies difficulty breathing,  shortness of breath, cough or sputum production.   Cardiovascular: Denies chest pain, chest tightness, palpitations or swelling in the hands or feet.  Neurological: Pt reports insomnia. Denies dizziness, difficulty with memory, difficulty with speech or problems with balance and coordination.   No other specific complaints in a complete review of systems (except as listed in HPI above).  Objective:   Physical Exam BP 122/78 (BP Location: Right Arm, Patient Position: Sitting, Cuff Size: Normal)   Ht 5' 3 (1.6 m)   Wt 156 lb (70.8 kg)   BMI 27.63 kg/m     Wt Readings from Last 3 Encounters:  04/07/24 169 lb 1.6 oz (76.7 kg)  02/11/24 185 lb 6.4 oz (84.1 kg)  10/20/23 205 lb (93 kg)    General: Appears her stated age, obese in NAD. Skin: Warm, dry and intact.  HEENT: Head: normal shape and size; Eyes: sclera white, no icterus, conjunctiva pink, PERRLA and EOMs intact; Ears: Right TM red with a cloudy mucoid effusion noted Neck: No adenopathy noted. Cardiovascular: Normal rate and rhythm.s or gallops noted. No JVD or BLE edema.  Pulmonary/Chest: Normal effort and positive vesicular breath sounds. No respiratory distress. No wheezes, rales or ronchi noted.  Musculoskeletal: No difficulty with gait.  Neurological: Alert and oriented.   BMET    Component Value Date/Time   NA 138 02/11/2024 1321   NA 142 04/06/2019 1554   NA 137 07/17/2014 1237   K 3.8 02/11/2024 1321   K 3.6 07/17/2014 1237   CL 102 02/11/2024 1321   CL 105 07/17/2014 1237   CO2 28 02/11/2024 1321   CO2 28 07/17/2014 1237   GLUCOSE 80 02/11/2024 1321   GLUCOSE 103 (H) 07/17/2014 1237   BUN 18 02/11/2024 1321   BUN 13 04/06/2019 1554   BUN 8 07/17/2014 1237   CREATININE 0.65 02/11/2024 1321   CALCIUM  9.3 02/11/2024 1321   CALCIUM  8.5 07/17/2014 1237   GFRNONAA 108 04/06/2019 1554   GFRNONAA >60 07/17/2014 1237   GFRAA 125 04/06/2019 1554   GFRAA >60 07/17/2014 1237    Lipid Panel     Component  Value Date/Time   CHOL 184 02/11/2024 1321   TRIG 157 (H) 02/11/2024 1321   HDL 45 (L) 02/11/2024 1321   CHOLHDL 4.1 02/11/2024 1321   VLDL 68.4 (H) 03/13/2021 1036   LDLCALC 112 (H) 02/11/2024 1321    CBC    Component Value Date/Time   WBC 8.4 02/11/2024 1321   RBC 4.68 02/11/2024 1321   HGB 13.1 02/11/2024 1321   HGB 13.5 07/17/2014 1237   HCT 40.5 02/11/2024 1321   HCT 40.2 07/17/2014 1237   PLT 236 02/11/2024  1321   PLT 204 07/17/2014 1237   MCV 86.5 02/11/2024 1321   MCV 89 07/17/2014 1237   MCH 28.0 02/11/2024 1321   MCHC 32.3 02/11/2024 1321   RDW 13.1 02/11/2024 1321   RDW 13.3 07/17/2014 1237    Hgb A1C Lab Results  Component Value Date   HGBA1C 5.2 02/11/2024           Assessment & Plan:   Assessment and Plan    Right ear pain Persistent ear pain with redness and cloudiness suggests possible ear infection. Allergic to penicillins. - Continue Flonase , especially while flying. - Take Sudafed OTC for up to three days. - Prescribed azithromycin  250 mg for five days, two tablets on day one, then one daily for four days.   Schedule an appointment for follow-up chronic conditions Angeline Laura, NP

## 2024-09-06 NOTE — Patient Instructions (Signed)
 Otitis Media, Adult    Otitis media is a condition in which the middle ear is red and swollen (inflamed) and full of fluid. The middle ear is the part of the ear that contains bones for hearing as well as air that helps send sounds to the brain. The condition usually goes away on its own.  What are the causes?  This condition is caused by a blockage in the eustachian tube. This tube connects the middle ear to the back of the nose. It normally allows air into the middle ear. The blockage is caused by fluid or swelling. Problems that can cause blockage include:  A cold or infection that affects the nose, mouth, or throat.  Allergies.  An irritant, such as tobacco smoke.  Adenoids that have become large. The adenoids are soft tissue located in the back of the throat, behind the nose and the roof of the mouth.  Growth or swelling in the upper part of the throat, just behind the nose (nasopharynx).  Damage to the ear caused by a change in pressure. This is called barotrauma.  What increases the risk?  You are more likely to develop this condition if you:  Smoke or are exposed to tobacco smoke.  Have an opening in the roof of your mouth (cleft palate).  Have acid reflux.  Have problems in your body's defense system (immune system).  What are the signs or symptoms?  Symptoms of this condition include:  Ear pain.  Fever.  Problems with hearing.  Being tired.  Fluid leaking from the ear.  Ringing in the ear.  How is this treated?  This condition can go away on its own within 3-5 days. But if the condition is caused by germs (bacteria) and does not go away on its own, or if it keeps coming back, your doctor may:  Give you antibiotic medicines.  Give you medicines for pain.  Follow these instructions at home:  Take over-the-counter and prescription medicines only as told by your doctor.  If you were prescribed an antibiotic medicine, take it as told by your doctor. Do not stop taking it even if you start to feel better.  Keep  all follow-up visits.  Contact a doctor if:  You have bleeding from your nose.  There is a lump on your neck.  You are not feeling better in 5 days.  You feel worse instead of better.  Get help right away if:  You have pain that is not helped with medicine.  You have swelling, redness, or pain around your ear.  You get a stiff neck.  You cannot move part of your face (paralysis).  You notice that the bone behind your ear hurts when you touch it.  You get a very bad headache.  Summary  Otitis media means that the middle ear is red, swollen, and full of fluid.  This condition usually goes away on its own.  If the problem does not go away, treatment may be needed. You may be given medicines to treat the infection or to treat your pain.  If you were prescribed an antibiotic medicine, take it as told by your doctor. Do not stop taking it even if you start to feel better.  Keep all follow-up visits.  This information is not intended to replace advice given to you by your health care provider. Make sure you discuss any questions you have with your health care provider.  Document Revised: 03/24/2021 Document Reviewed: 03/24/2021  Elsevier  Patient Education  2024 [employer].

## 2024-09-09 ENCOUNTER — Other Ambulatory Visit: Payer: Self-pay | Admitting: Internal Medicine

## 2024-09-11 NOTE — Telephone Encounter (Signed)
 Requested medication (s) are due for refill today: yes  Requested medication (s) are on the active medication list: yes  Last refill:  07/25/24  Future visit scheduled: yes  Notes to clinic:  Unable to refill per protocol, cannot delegate.      Requested Prescriptions  Pending Prescriptions Disp Refills   clonazePAM  (KLONOPIN ) 0.5 MG tablet [Pharmacy Med Name: CLONAZEPAM  0.5 MG TABLET] 30 tablet 0    Sig: TAKE 1 TABLET BY MOUTH EVERY DAY AS NEEDED FOR ANXIETY     Not Delegated - Psychiatry: Anxiolytics/Hypnotics 2 Failed - 09/11/2024  4:20 PM      Failed - This refill cannot be delegated      Failed - Urine Drug Screen completed in last 360 days      Passed - Patient is not pregnant      Passed - Valid encounter within last 6 months    Recent Outpatient Visits           5 days ago Acute mucoid otitis media of right ear   [place] [facility] [place], Angeline ORN, NP   7 months ago Encounter for general adult medical examination with abnormal findings   [place] [facility] [place], Angeline ORN, NP

## 2024-10-06 ENCOUNTER — Ambulatory Visit: Admitting: Internal Medicine

## 2024-10-10 ENCOUNTER — Encounter: Payer: Self-pay | Admitting: Internal Medicine

## 2024-10-13 ENCOUNTER — Ambulatory Visit: Admitting: Internal Medicine

## 2024-11-06 ENCOUNTER — Other Ambulatory Visit: Payer: Self-pay | Admitting: Internal Medicine

## 2024-11-06 DIAGNOSIS — I10 Essential (primary) hypertension: Secondary | ICD-10-CM

## 2024-11-07 NOTE — Telephone Encounter (Signed)
 Requested Prescriptions  Pending Prescriptions Disp Refills   amLODipine  (NORVASC ) 5 MG tablet [Pharmacy Med Name: AMLODIPINE  BESYLATE 5 MG TAB] 30 tablet 5    Sig: TAKE 1 TABLET BY MOUTH DAILY. OFFICE VISIT NEEDED FOR ADDITIONAL REFILLS     Cardiovascular: Calcium  Channel Blockers 2 Passed - 11/07/2024 12:49 PM      Passed - Last BP in normal range    BP Readings from Last 1 Encounters:  09/06/24 122/78         Passed - Last Heart Rate in normal range    Pulse Readings from Last 1 Encounters:  04/07/24 66         Passed - Valid encounter within last 6 months    Recent Outpatient Visits           2 months ago Acute mucoid otitis media of right ear   [place] [facility] [place], Angeline ORN, NP   9 months ago Encounter for general adult medical examination with abnormal findings   [place] [facility] [place], Angeline ORN, NP               traZODone  (DESYREL ) 50 MG tablet [Pharmacy Med Name: TRAZODONE  50 MG TABLET] 30 tablet 2    Sig: TAKE 1 TABLET BY MOUTH EVERYDAY AT BEDTIME     Psychiatry: Antidepressants - Serotonin Modulator Passed - 11/07/2024 12:49 PM      Passed - Completed PHQ-2 or PHQ-9 in the last 360 days      Passed - Valid encounter within last 6 months    Recent Outpatient Visits           2 months ago Acute mucoid otitis media of right ear   [place] [facility] [place], Angeline ORN, NP   9 months ago Encounter for general adult medical examination with abnormal findings   [place] [facility] [place], Angeline ORN, NP               losartan  (COZAAR ) 100 MG tablet [Pharmacy Med Name: LOSARTAN  POTASSIUM 100 MG TAB] 30 tablet 2    Sig: TAKE 1 TABLET BY MOUTH EVERY DAY     Cardiovascular:  Angiotensin Receptor Blockers Failed - 11/07/2024 12:49 PM      Failed - Cr in normal range and within 180 days    Creat  Date Value Ref Range Status  02/11/2024 0.65 0.50 - 0.99 mg/dL Final          Failed - K in normal range and within 180 days    Potassium  Date Value Ref Range Status  02/11/2024 3.8 3.5 - 5.3 mmol/L Final  07/17/2014 3.6 3.5 - 5.1 mmol/L Final         Passed - Patient is not pregnant      Passed - Last BP in normal range    BP Readings from Last 1 Encounters:  09/06/24 122/78         Passed - Valid encounter within last 6 months    Recent Outpatient Visits           2 months ago Acute mucoid otitis media of right ear   [place] [facility] [place], Angeline ORN, NP   9 months ago Encounter for general adult medical examination with abnormal findings   [place] [facility] [place], Angeline ORN, NP

## 2024-11-24 ENCOUNTER — Other Ambulatory Visit: Payer: Self-pay | Admitting: Internal Medicine

## 2024-11-28 ENCOUNTER — Encounter: Payer: Self-pay | Admitting: Internal Medicine

## 2024-11-28 NOTE — Telephone Encounter (Signed)
 Requested medication (s) are due for refill today: Yes  Requested medication (s) are on the active medication list: Yes  Last refill:  09/11/24  Future visit scheduled: No  Notes to clinic:  Not delegated.    Requested Prescriptions  Pending Prescriptions Disp Refills   clonazePAM  (KLONOPIN ) 0.5 MG tablet [Pharmacy Med Name: CLONAZEPAM  0.5 MG TABLET] 30 tablet 0    Sig: TAKE 1 TABLET BY MOUTH EVERY DAY AS NEEDED FOR ANXIETY     Not Delegated - Psychiatry: Anxiolytics/Hypnotics 2 Failed - 11/28/2024  2:04 PM      Failed - This refill cannot be delegated      Failed - Urine Drug Screen completed in last 360 days      Passed - Patient is not pregnant      Passed - Valid encounter within last 6 months    Recent Outpatient Visits           2 months ago Acute mucoid otitis media of right ear   [place] [facility] [place], Angeline ORN, NP   9 months ago Encounter for general adult medical examination with abnormal findings   [place] [facility] [place], Angeline ORN, NP

## 2024-12-01 ENCOUNTER — Ambulatory Visit: Admitting: Internal Medicine

## 2024-12-07 ENCOUNTER — Ambulatory Visit: Admitting: Internal Medicine

## 2024-12-07 ENCOUNTER — Encounter: Payer: Self-pay | Admitting: Internal Medicine

## 2024-12-07 VITALS — BP 122/78 | Ht 63.0 in | Wt 151.6 lb

## 2024-12-07 DIAGNOSIS — L409 Psoriasis, unspecified: Secondary | ICD-10-CM | POA: Diagnosis not present

## 2024-12-07 DIAGNOSIS — R7303 Prediabetes: Secondary | ICD-10-CM

## 2024-12-07 DIAGNOSIS — E782 Mixed hyperlipidemia: Secondary | ICD-10-CM | POA: Diagnosis not present

## 2024-12-07 DIAGNOSIS — F32A Depression, unspecified: Secondary | ICD-10-CM | POA: Diagnosis not present

## 2024-12-07 DIAGNOSIS — F419 Anxiety disorder, unspecified: Secondary | ICD-10-CM | POA: Diagnosis not present

## 2024-12-07 DIAGNOSIS — I10 Essential (primary) hypertension: Secondary | ICD-10-CM

## 2024-12-07 DIAGNOSIS — E663 Overweight: Secondary | ICD-10-CM

## 2024-12-07 DIAGNOSIS — F5101 Primary insomnia: Secondary | ICD-10-CM | POA: Diagnosis not present

## 2024-12-07 DIAGNOSIS — Z6826 Body mass index (BMI) 26.0-26.9, adult: Secondary | ICD-10-CM | POA: Diagnosis not present

## 2024-12-07 DIAGNOSIS — L729 Follicular cyst of the skin and subcutaneous tissue, unspecified: Secondary | ICD-10-CM

## 2024-12-07 MED ORDER — TRAZODONE HCL 100 MG PO TABS: 100.0000 mg | ORAL_TABLET | Freq: Every day | ORAL | 1 refills | Status: AC

## 2024-12-07 MED ORDER — CLONAZEPAM 0.5 MG PO TABS: 0.5000 mg | ORAL_TABLET | Freq: Every day | ORAL | 0 refills | Status: AC

## 2024-12-07 NOTE — Assessment & Plan Note (Signed)
 Will increase trazodone  to 100 mg nightly as needed We will monitor

## 2024-12-07 NOTE — Patient Instructions (Signed)

## 2024-12-07 NOTE — Assessment & Plan Note (Signed)
 Controlled on amlodipine  5 mg and losartan  100 mg daily Reinforced DASH diet and exercise for weight loss Knee function reviewed

## 2024-12-07 NOTE — Assessment & Plan Note (Signed)
 Stable on clonazepam  0.5 mg daily as needed Support offered

## 2024-12-07 NOTE — Assessment & Plan Note (Signed)
 A1c reviewed Encourage low-carb diet and exercise for weight loss

## 2024-12-07 NOTE — Assessment & Plan Note (Signed)
 Encourage diet and exercise for weight loss

## 2024-12-07 NOTE — Assessment & Plan Note (Signed)
 Lipid profile reviewed Encouraged her to consume a low-fat diet Not taking atorvastatin  as prescribed, will discontinue due to nonuse

## 2024-12-07 NOTE — Assessment & Plan Note (Signed)
 Not medicated We will monitor

## 2024-12-07 NOTE — Progress Notes (Signed)
 Subjective:    Patient ID: Jasmine Miller, female    DOB: 1976-06-13, 48 y.o.   MRN: [id number]  HPI  Patient presents to clinic today for follow-up of chronic conditions.  HTN: Her BP today is 136/86.  She is taking losartan  and amlodipine  as prescribed.  ECG from 05/2016 reviewed.  Psoriasis: She is not taking humira.  She follows with dermatology.  Anxiety and depression: Chronic, managed on clonazepam  as needed.  She is not currently seeing a therapist.  She denies SI/HI.  Insomnia: She has difficulty falling asleep.  She is taking trazodone  as prescribed but reports that she cannot stay asleep.  There is no sleep study on file.  HLD: Her last LDL was 111, triglycerides 157, 01/2024.  She is not taking atorvastatin  as prescribed.  She tries to consume a low-fat diet.  Prediabetes: Her last A1c was 5.2%, 01/2024.  She is not taking any oral diabetic medication at this time.  She does not check her sugars.  She also reports a cyst on her right buttock and her left thigh.  She has noticed this over the last few months.  They have not necessarily gotten bigger in size.  They are not tender or painful.  Review of Systems     Past Medical History:  Diagnosis Date   Hypertension     Current Outpatient Medications  Medication Sig Dispense Refill   amLODipine  (NORVASC ) 5 MG tablet TAKE 1 TABLET BY MOUTH DAILY. OFFICE VISIT NEEDED FOR ADDITIONAL REFILLS 30 tablet 5   atorvastatin  (LIPITOR) 20 MG tablet Take 1 tablet (20 mg total) by mouth daily. (Patient not taking: Reported on 09/06/2024) 90 tablet 3   azithromycin  (ZITHROMAX ) 250 MG tablet Take 2 tabs today, then 1 tab daily x 4 days 6 tablet 0   BLACK COHOSH PO Take by mouth.     clonazePAM  (KLONOPIN ) 0.5 MG tablet TAKE 1 TABLET BY MOUTH EVERY DAY AS NEEDED FOR ANXIETY 30 tablet 0   losartan  (COZAAR ) 100 MG tablet TAKE 1 TABLET BY MOUTH EVERY DAY 30 tablet 2   Multiple Vitamin (MULTIVITAMIN) tablet Take 1 tablet by mouth daily.  Ronal Dines     ondansetron  (ZOFRAN -ODT) 4 MG disintegrating tablet Take 1 tablet (4 mg total) by mouth every 8 (eight) hours as needed for nausea or vomiting. (Patient not taking: Reported on 09/06/2024) 20 tablet 0   TIRZEPATIDE Streeter Inject into the skin. Tuesdays     traZODone  (DESYREL ) 50 MG tablet TAKE 1 TABLET BY MOUTH EVERYDAY AT BEDTIME 30 tablet 2   No current facility-administered medications for this visit.    Allergies  Allergen Reactions   Chantix  [Varenicline  Tartrate] Swelling    Lip swelling and rash   Penicillins Anaphylaxis    Family History  Problem Relation Age of Onset   Diabetes Mother    Hypertension Mother    Healthy Brother    Breast cancer Maternal Grandmother    Breast cancer Maternal Aunt    Ovarian cancer Maternal Aunt    Breast cancer Maternal Aunt     Social History   Socioeconomic History   Marital status: Single    Spouse name: Not on file   Number of children: Not on file   Years of education: Not on file   Highest education level: Not on file  Occupational History   Not on file  Tobacco Use   Smoking status: Some Days    Current packs/day: 0.00    Average packs/day: 1 pack/day  for 15.0 years (15.0 ttl pk-yrs)    Types: E-cigarettes, Cigarettes    Start date: 2005    Last attempt to quit: 2020    Years since quitting: 5.9   Smokeless tobacco: Never   Tobacco comments:    only smokes E-Cigarettes  Vaping Use   Vaping status: Every Day   Substances: Nicotine, Flavoring   Devices: Novo.  Refillable  Substance and Sexual Activity   Alcohol use: Not Currently   Drug use: No   Sexual activity: Not on file  Other Topics Concern   Not on file  Social History Narrative   Not on file   Social Drivers of Health   Tobacco Use: High Risk (09/06/2024)   Patient History    Smoking Tobacco Use: Some Days    Smokeless Tobacco Use: Never    Passive Exposure: Not on file  Financial Resource Strain: Not on file  Food Insecurity: Not on  file  Transportation Needs: Not on file  Physical Activity: Not on file  Stress: Not on file  Social Connections: Not on file  Intimate Partner Violence: Not on file  Depression (PHQ2-9): Medium Risk (09/06/2024)   Depression (PHQ2-9)    PHQ-2 Score: 5  Alcohol Screen: Low Risk (06/22/2023)   Alcohol Screen    Last Alcohol Screening Score (AUDIT): 1  Housing: Not on file  Utilities: Not on file  Health Literacy: Not on file     Constitutional:   Denies fever, malaise, fatigue, headache or abrupt weight changes.  HEENT:  Denies eye pain, eye redness, ringing in the ears, ear pain, wax buildup, runny nose, nasal congestion, bloody nose or sore throat. Respiratory: Denies difficulty breathing, shortness of breath, cough or sputum production.   Cardiovascular: Denies chest pain, chest tightness, palpitations or swelling in the hands or feet.  Gastrointestinal: Denies abdominal pain, bloating, constipation, diarrhea or blood in the stool.  GU: Denies urgency, frequency, pain with urination, burning sensation, blood in urine, odor or discharge. Musculoskeletal: Denies decrease in range of motion, difficulty with gait, muscle pain or joint pain and swelling.  Skin: Patient reports mass of right buttock, left thigh.  Denies redness, rashes, or ulcercations.  Neurological: Patient reports insomnia.  Denies dizziness, difficulty with memory, difficulty with speech or problems with balance and coordination.  Psych: Patient has a history of anxiety and depression.  Denies SI/HI.  No other specific complaints in a complete review of systems (except as listed in HPI above).  Objective:   Physical Exam  BP 122/78 (BP Location: Left Arm, Patient Position: Sitting, Cuff Size: Normal)   Ht 5' 3 (1.6 m)   Wt 151 lb 9.6 oz (68.8 kg)   BMI 26.85 kg/m    Wt Readings from Last 3 Encounters:  09/06/24 156 lb (70.8 kg)  04/07/24 169 lb 1.6 oz (76.7 kg)  02/11/24 185 lb 6.4 oz (84.1 kg)     General: Appears her stated age, obese in NAD. Skin: Warm, dry and intact.  1 cm subcutaneous nodule noted at the top of the right buttock in the left anterior thigh. HEENT: Head: normal shape and size; Eyes: sclera white, no icterus, conjunctiva pink, PERRLA and EOMs intact;  Cardiovascular: Normal rate and rhythm. S1,S2 noted.  No murmur, rubs or gallops noted. No JVD or BLE edema.  Pulmonary/Chest: Normal effort and positive vesicular breath sounds. No respiratory distress. No wheezes, rales or ronchi noted.  Musculoskeletal: No difficulty with gait.  Neurological: Alert and oriented.  Coordination normal.  Psychiatric: Mood and affect normal. Behavior is normal. Judgment and thought content normal.     BMET    Component Value Date/Time   NA 138 02/11/2024 1321   NA 142 04/06/2019 1554   NA 137 07/17/2014 1237   K 3.8 02/11/2024 1321   K 3.6 07/17/2014 1237   CL 102 02/11/2024 1321   CL 105 07/17/2014 1237   CO2 28 02/11/2024 1321   CO2 28 07/17/2014 1237   GLUCOSE 80 02/11/2024 1321   GLUCOSE 103 (H) 07/17/2014 1237   BUN 18 02/11/2024 1321   BUN 13 04/06/2019 1554   BUN 8 07/17/2014 1237   CREATININE 0.65 02/11/2024 1321   CALCIUM  9.3 02/11/2024 1321   CALCIUM  8.5 07/17/2014 1237   GFRNONAA 108 04/06/2019 1554   GFRNONAA >60 07/17/2014 1237   GFRAA 125 04/06/2019 1554   GFRAA >60 07/17/2014 1237    Lipid Panel     Component Value Date/Time   CHOL 184 02/11/2024 1321   TRIG 157 (H) 02/11/2024 1321   HDL 45 (L) 02/11/2024 1321   CHOLHDL 4.1 02/11/2024 1321   VLDL 68.4 (H) 03/13/2021 1036   LDLCALC 112 (H) 02/11/2024 1321    CBC    Component Value Date/Time   WBC 8.4 02/11/2024 1321   RBC 4.68 02/11/2024 1321   HGB 13.1 02/11/2024 1321   HGB 13.5 07/17/2014 1237   HCT 40.5 02/11/2024 1321   HCT 40.2 07/17/2014 1237   PLT 236 02/11/2024 1321   PLT 204 07/17/2014 1237   MCV 86.5 02/11/2024 1321   MCV 89 07/17/2014 1237   MCH 28.0 02/11/2024 1321    MCHC 32.3 02/11/2024 1321   RDW 13.1 02/11/2024 1321   RDW 13.3 07/17/2014 1237    Hgb A1C Lab Results  Component Value Date   HGBA1C 5.2 02/11/2024            Assessment & Plan:   Cyst of skin:  Monitor for now Did discuss referral for dermatology for excision however she would like to hold off at this time   RTC in 6 months for annual exam Angeline Laura, NP

## 2025-01-25 ENCOUNTER — Ambulatory Visit: Payer: Self-pay | Admitting: Internal Medicine

## 2025-06-07 ENCOUNTER — Encounter: Admitting: Internal Medicine
